# Patient Record
Sex: Female | Born: 1955 | Race: Black or African American | Hispanic: No | Smoking: Former smoker
Health system: Southern US, Community
[De-identification: ages and names within clinical notes are randomized; demographics above are authoritative.]

## PROBLEM LIST (undated history)

## (undated) DIAGNOSIS — D649 Anemia, unspecified: Secondary | ICD-10-CM

## (undated) DIAGNOSIS — N760 Acute vaginitis: Secondary | ICD-10-CM

## (undated) DIAGNOSIS — K5909 Other constipation: Principal | ICD-10-CM

## (undated) DIAGNOSIS — R0602 Shortness of breath: Secondary | ICD-10-CM

## (undated) DIAGNOSIS — E785 Hyperlipidemia, unspecified: Secondary | ICD-10-CM

## (undated) DIAGNOSIS — M545 Low back pain: Secondary | ICD-10-CM

## (undated) HISTORY — DX: Hyperlipidemia, unspecified: E78.5

## (undated) HISTORY — DX: Anemia, unspecified: D64.9

## (undated) HISTORY — PX: ABDOMINAL HYSTERECTOMY: SHX81

## (undated) HISTORY — DX: Other constipation: K59.09

## (undated) HISTORY — DX: Shortness of breath: R06.02

## (undated) HISTORY — DX: Low back pain: M54.5

## (undated) HISTORY — DX: Acute vaginitis: N76.0

---

## 1999-09-18 ENCOUNTER — Encounter: Payer: Self-pay | Admitting: Emergency Medicine

## 1999-09-18 ENCOUNTER — Emergency Department (HOSPITAL_COMMUNITY): Admission: EM | Admit: 1999-09-18 | Discharge: 1999-09-18 | Payer: Self-pay | Admitting: Emergency Medicine

## 2001-02-18 ENCOUNTER — Encounter: Admission: RE | Admit: 2001-02-18 | Discharge: 2001-02-18 | Payer: Self-pay | Admitting: Family Medicine

## 2001-02-18 ENCOUNTER — Encounter: Payer: Self-pay | Admitting: Family Medicine

## 2001-03-02 ENCOUNTER — Other Ambulatory Visit: Admission: RE | Admit: 2001-03-02 | Discharge: 2001-03-02 | Payer: Self-pay | Admitting: Obstetrics and Gynecology

## 2002-12-05 ENCOUNTER — Other Ambulatory Visit: Admission: RE | Admit: 2002-12-05 | Discharge: 2002-12-05 | Payer: Self-pay | Admitting: Obstetrics and Gynecology

## 2005-12-24 ENCOUNTER — Emergency Department (HOSPITAL_COMMUNITY): Admission: EM | Admit: 2005-12-24 | Discharge: 2005-12-24 | Payer: Self-pay | Admitting: Emergency Medicine

## 2006-01-13 ENCOUNTER — Encounter: Admission: RE | Admit: 2006-01-13 | Discharge: 2006-03-24 | Payer: Self-pay | Admitting: Sports Medicine

## 2006-01-26 ENCOUNTER — Ambulatory Visit (HOSPITAL_COMMUNITY): Admission: RE | Admit: 2006-01-26 | Discharge: 2006-01-26 | Payer: Self-pay | Admitting: Sports Medicine

## 2009-01-01 ENCOUNTER — Emergency Department (HOSPITAL_COMMUNITY): Admission: EM | Admit: 2009-01-01 | Discharge: 2009-01-01 | Payer: Self-pay | Admitting: Emergency Medicine

## 2009-11-30 ENCOUNTER — Emergency Department (HOSPITAL_COMMUNITY): Admission: EM | Admit: 2009-11-30 | Discharge: 2009-11-30 | Payer: Self-pay | Admitting: Emergency Medicine

## 2011-01-14 LAB — POCT CARDIAC MARKERS: Troponin i, poc: <0.05 ng/mL (ref 0.00–0.09)

## 2011-01-14 LAB — DIFFERENTIAL
Basophils Absolute: 0 10*3/uL (ref 0.0–0.1)
Eosinophils Absolute: 0.1 10*3/uL (ref 0.0–0.7)
Eosinophils Relative: 2 % (ref 0–5)
Lymphocytes Relative: 22 % (ref 12–46)
Monocytes Absolute: 0.4 10*3/uL (ref 0.1–1.0)
Neutrophils Relative %: 68 % (ref 43–77)

## 2011-01-14 LAB — CBC
Hemoglobin: 12.8 g/dL (ref 12.0–15.0)
MCV: 99.3 fL (ref 78.0–100.0)
Platelets: 219 10*3/uL (ref 150–400)
RBC: 3.72 MIL/uL — ABNORMAL LOW (ref 3.87–5.11)

## 2011-01-14 LAB — BASIC METABOLIC PANEL
BUN: 8 mg/dL (ref 6–23)
Creatinine, Ser: 0.75 mg/dL (ref 0.4–1.2)

## 2013-02-01 ENCOUNTER — Emergency Department (HOSPITAL_COMMUNITY): Payer: Managed Care, Other (non HMO)

## 2013-02-01 ENCOUNTER — Emergency Department (HOSPITAL_COMMUNITY)
Admission: EM | Admit: 2013-02-01 | Discharge: 2013-02-01 | Disposition: A | Discharge status: Home / Self Care | Payer: Managed Care, Other (non HMO) | Attending: Emergency Medicine | Admitting: Emergency Medicine

## 2013-02-01 ENCOUNTER — Encounter (HOSPITAL_COMMUNITY): Payer: Self-pay | Admitting: Emergency Medicine

## 2013-02-01 ENCOUNTER — Emergency Department (INDEPENDENT_AMBULATORY_CARE_PROVIDER_SITE_OTHER)
Admission: EM | Admit: 2013-02-01 | Discharge: 2013-02-01 | Disposition: A | Discharge status: Nursing Home (Includes ICF) | Payer: Managed Care, Other (non HMO) | Source: Home / Self Care | Attending: Family Medicine | Admitting: Family Medicine

## 2013-02-01 DIAGNOSIS — M545 Low back pain, unspecified: Secondary | ICD-10-CM | POA: Insufficient documentation

## 2013-02-01 DIAGNOSIS — M1610 Unilateral primary osteoarthritis, unspecified hip: Secondary | ICD-10-CM | POA: Insufficient documentation

## 2013-02-01 DIAGNOSIS — M25559 Pain in unspecified hip: Secondary | ICD-10-CM | POA: Insufficient documentation

## 2013-02-01 DIAGNOSIS — R269 Unspecified abnormalities of gait and mobility: Secondary | ICD-10-CM | POA: Insufficient documentation

## 2013-02-01 DIAGNOSIS — M47816 Spondylosis without myelopathy or radiculopathy, lumbar region: Secondary | ICD-10-CM

## 2013-02-01 DIAGNOSIS — R1032 Left lower quadrant pain: Secondary | ICD-10-CM

## 2013-02-01 DIAGNOSIS — M169 Osteoarthritis of hip, unspecified: Secondary | ICD-10-CM | POA: Insufficient documentation

## 2013-02-01 DIAGNOSIS — Z87891 Personal history of nicotine dependence: Secondary | ICD-10-CM | POA: Insufficient documentation

## 2013-02-01 DIAGNOSIS — M160 Bilateral primary osteoarthritis of hip: Secondary | ICD-10-CM

## 2013-02-01 DIAGNOSIS — M47817 Spondylosis without myelopathy or radiculopathy, lumbosacral region: Secondary | ICD-10-CM | POA: Insufficient documentation

## 2013-02-01 LAB — CBC WITH DIFFERENTIAL/PLATELET
Basophils Absolute: 0 10*3/uL (ref 0.0–0.1)
Basophils Relative: 0 % (ref 0–1)
Eosinophils Relative: 7 % — ABNORMAL HIGH (ref 0–5)
HCT: 37.6 % (ref 36.0–46.0)
Hemoglobin: 13.1 g/dL (ref 12.0–15.0)
Lymphs Abs: 1.8 10*3/uL (ref 0.7–4.0)
MCH: 32.3 pg (ref 26.0–34.0)
MCHC: 34.8 g/dL (ref 30.0–36.0)
MCV: 92.8 fL (ref 78.0–100.0)
Neutro Abs: 2.9 10*3/uL (ref 1.7–7.7)
Neutrophils Relative %: 52 % (ref 43–77)
Platelets: 245 10*3/uL (ref 150–400)
RBC: 4.05 MIL/uL (ref 3.87–5.11)

## 2013-02-01 LAB — URINE MICROSCOPIC-ADD ON

## 2013-02-01 LAB — COMPREHENSIVE METABOLIC PANEL
AST: 17 U/L (ref 0–37)
Albumin: 3.8 g/dL (ref 3.5–5.2)
Alkaline Phosphatase: 54 U/L (ref 39–117)
GFR calc Af Amer: >90 mL/min (ref >90)
Total Bilirubin: 0.3 mg/dL (ref 0.3–1.2)

## 2013-02-01 LAB — URINALYSIS, ROUTINE W REFLEX MICROSCOPIC
Specific Gravity, Urine: 1.015 (ref 1.005–1.030)
Urobilinogen, UA: 1 mg/dL (ref 0.0–1.0)
pH: 6.5 (ref 5.0–8.0)

## 2013-02-01 MED ORDER — MELOXICAM 15 MG PO TABS: 15.0000 mg | ORAL_TABLET | Freq: Every day | ORAL | Status: DC

## 2013-02-01 MED ORDER — IBUPROFEN 400 MG PO TABS
800.0000 mg | ORAL_TABLET | Freq: Once | ORAL | Status: AC
  Administered 2013-02-01: 800 mg via ORAL
  Filled 2013-02-01: qty 2

## 2013-02-01 NOTE — ED Provider Notes (Signed)
History    This chart was scribed for non-physician practitioner Arthor Captain, PA-C working with Joya Gaskins, MD by Gerlean Ren, ED Scribe. This patient was seen in room TR07C/TR07C and the patient's care was started at 7:42 PM.   CSN: [id number]  Arrival date & time 02/01/13  1654   First MD Initiated Contact with Patient 02/01/13 1754      Chief Complaint  Patient presents with  . Hip pain Back pain     The history is provided by the patient. No language interpreter was used.  Robin Clark is a 57 y.o. female who presents to the Emergency Department complaining of left hip pain with associated left lower back pain that was first noticed 5 days ago upon waking up that has waxed-and-waned since with no obvious triggers.  Pain is dull and aching that will become sharp and shooting with certain movements and is worsened by ambulation.  No associated injuries, traumas, or surgeries.  Pt denies weakness in legs, loss of bowel or bladder control, dysuria, hematuria, nausea, emesis, abdominal pain.  No h/o IV drug use.  No recent procedures.  No meningismus.  Pt was seen at Urgent Care earlier today.   History reviewed. No pertinent past medical history.  Past Surgical History  Procedure Laterality Date  . Abdominal hysterectomy      No family history on file.  History  Substance Use Topics  . Smoking status: Former Games developer  . Smokeless tobacco: Not on file  . Alcohol Use: No    No OB history provided.   Review of Systems  Constitutional: Negative for fever and chills.  HENT: Negative for trouble swallowing.   Respiratory: Negative for shortness of breath.   Cardiovascular: Negative for chest pain.  Gastrointestinal: Negative for nausea, vomiting, abdominal pain, diarrhea and constipation.  Genitourinary: Negative for dysuria, urgency, hematuria, flank pain and pelvic pain.  Musculoskeletal: Positive for back pain, arthralgias (left hip) and gait problem (Antalgic). Negative  for myalgias and joint swelling.  Skin: Negative for rash.  Neurological: Negative for weakness and numbness.  All other systems reviewed and are negative.    Allergies  Review of patient's allergies indicates no known allergies.  Home Medications   Current Outpatient Rx  Name  Route  Sig  Dispense  Refill  . Naproxen Sodium (ALEVE PO)   Oral   Take 1 tablet by mouth 2 (two) times daily as needed (pain).            BP 122/70  Pulse 85  Temp(Src) 98.6 F (37 C) (Oral)  Resp 20  SpO2 99%  Physical Exam  Nursing note and vitals reviewed. Constitutional: She is oriented to person, place, and time. She appears well-developed and well-nourished. No distress.  HENT:  Head: Normocephalic and atraumatic.  Eyes: EOM are normal.  Neck: Neck supple. No tracheal deviation present.  Cardiovascular: Normal rate.   Pulmonary/Chest: Effort normal and breath sounds normal. No respiratory distress.  Abdominal: Soft. Bowel sounds are normal. She exhibits no distension and no mass. There is no tenderness. There is no guarding.  Musculoskeletal: Normal range of motion.  Tender to palpation of lumbar paraspinal muscles, tender to palpation of iliopsoas muscle  Neurological: She is alert and oriented to person, place, and time.  5/5 strength in LEs,DTRs normal, ambulatory  Skin: Skin is warm and dry.  Psychiatric: She has a normal mood and affect. Her behavior is normal.    ED Course  Procedures (including critical care time)  DIAGNOSTIC STUDIES: Oxygen Saturation is 99% on room air, normal by my interpretation.    COORDINATION OF CARE: 7:49 PM- Patient informed of clinical course, understands medical decision-making process, and agrees with plan.  Results for orders placed during the hospital encounter of 02/01/13  URINALYSIS, ROUTINE W REFLEX MICROSCOPIC      Result Value Range   Color, Urine YELLOW  YELLOW   APPearance CLEAR  CLEAR   Specific Gravity, Urine 1.015  1.005 - 1.030    pH 6.5  5.0 - 8.0   Glucose, UA NEGATIVE  NEGATIVE mg/dL   Hgb urine dipstick TRACE (*) NEGATIVE   Bilirubin Urine NEGATIVE  NEGATIVE   Ketones, ur NEGATIVE  NEGATIVE mg/dL   Protein, ur NEGATIVE  NEGATIVE mg/dL   Urobilinogen, UA 1.0  0.0 - 1.0 mg/dL   Nitrite NEGATIVE  NEGATIVE   Leukocytes, UA SMALL (*) NEGATIVE  CBC WITH DIFFERENTIAL      Result Value Range   WBC 5.6  4.0 - 10.5 K/uL   RBC 4.05  3.87 - 5.11 MIL/uL   Hemoglobin 13.1  12.0 - 15.0 g/dL   HCT 95.6  21.3 - 08.6 %   MCV 92.8  78.0 - 100.0 fL   MCH 32.3  26.0 - 34.0 pg   MCHC 34.8  30.0 - 36.0 g/dL   RDW 57.8  46.9 - 62.9 %   Platelets 245  150 - 400 K/uL   Neutrophils Relative 52  43 - 77 %   Neutro Abs 2.9  1.7 - 7.7 K/uL   Lymphocytes Relative 32  12 - 46 %   Lymphs Abs 1.8  0.7 - 4.0 K/uL   Monocytes Relative 8  3 - 12 %   Monocytes Absolute 0.4  0.1 - 1.0 K/uL   Eosinophils Relative 7 (*) 0 - 5 %   Eosinophils Absolute 0.4  0.0 - 0.7 K/uL   Basophils Relative 0  0 - 1 %   Basophils Absolute 0.0  0.0 - 0.1 K/uL  URINE MICROSCOPIC-ADD ON      Result Value Range   Squamous Epithelial / LPF RARE  RARE   WBC, UA 0-2  <3 WBC/hpf   RBC / HPF 3-6  <3 RBC/hpf   No results found.   1. Lumbar facet arthropathy   2. Osteoarthritis of both hips       MDM   Patient with back pain.  No neurological deficits and normal neuro exam.  Patient can walk but states is painful.  No loss of bowel or bladder control.  No concern for cauda equina.  No fever, night sweats, weight loss, h/o cancer, IVDU.  RICE protocol and pain medicine indicated and discussed with patient.  Follow up with orthopedics      I personally performed the services described in this documentation, which was scribed in my presence. The recorded information has been reviewed and is accurate.     Arthor Captain, PA-C 02/04/13 1307

## 2013-02-01 NOTE — ED Notes (Signed)
Reports 2 weeks ago noticing pain in left hip and leg when going up steps.  Last week reports feeling pain a few nights.  Since then pain in groin, left leg .  Denies fall.  Patient did resume an exercise routine approx 2 weeks ago.

## 2013-02-01 NOTE — ED Notes (Signed)
Pt dc to home.   Pt states understanding to dc instructions.  Pt ambulatory to exit without difficulty.  Pt denies need for w/c. 

## 2013-02-01 NOTE — ED Notes (Signed)
Instructed to change into gown for physician examination

## 2013-02-01 NOTE — ED Notes (Signed)
Pt called no answer x1 

## 2013-02-01 NOTE — ED Notes (Signed)
In room while dr Artis Flock examined patient.

## 2013-02-01 NOTE — ED Provider Notes (Addendum)
History     CSN: [id number]  Arrival date & time 02/01/13  1518   First MD Initiated Contact with Patient 02/01/13 1601      Chief Complaint  Patient presents with  . Hip Pain    (Consider location/radiation/quality/duration/timing/severity/associated sxs/prior treatment) Patient is a 57 y.o. female presenting with hip pain. The history is provided by the patient.  Hip Pain This is a new problem. The current episode started more than 1 week ago (initially felt to be in left thigh with going up stairs, now awakening at night with pain in left flank and llq, no neuro sx, no gi or gu sx, no leg edema,.NKI.). The problem occurs constantly. The problem has been gradually worsening. Associated symptoms include abdominal pain.    History reviewed. No pertinent past medical history.  Past Surgical History  Procedure Laterality Date  . Abdominal hysterectomy      No family history on file.  History  Substance Use Topics  . Smoking status: Never Smoker   . Smokeless tobacco: Not on file  . Alcohol Use: No    OB History   Grav Para Term Preterm Abortions TAB SAB Ect Mult Living                  Review of Systems  Constitutional: Negative.   Gastrointestinal: Positive for abdominal pain.  Genitourinary: Positive for flank pain and pelvic pain.  Musculoskeletal: Negative.     Allergies  Review of patient's allergies indicates no known allergies.  Home Medications   Current Outpatient Rx  Name  Route  Sig  Dispense  Refill  . Naproxen Sodium (ALEVE PO)   Oral   Take by mouth.           BP 122/79  Pulse 75  Temp(Src) 97.8 F (36.6 C) (Oral)  Resp 20  SpO2 100%  Physical Exam  Nursing note and vitals reviewed. Constitutional: She is oriented to person, place, and time. She appears well-developed and well-nourished.  Abdominal: Soft. Bowel sounds are normal. She exhibits no distension and no mass. There is no hepatosplenomegaly. There is tenderness in the left  lower quadrant. There is no rigidity, no rebound, no guarding and no CVA tenderness. No hernia.  Musculoskeletal: Normal range of motion.       Left hip: Normal. She exhibits normal range of motion, normal strength, no tenderness, no bony tenderness and no swelling.       Legs: Neurological: She is alert and oriented to person, place, and time.  Skin: Skin is warm and dry.    ED Course  Procedures (including critical care time)  Labs Reviewed - No data to display No results found.   1. LLQ abdominal pain       MDM  Sent for prob ct abd/pelvis for vague left thigh pain of 2 weeks which now definitely is focal llq and flank tenderness in origin.        Linna Hoff, MD 02/01/13 1631  Linna Hoff, MD 02/01/13 [phone]

## 2013-02-01 NOTE — ED Notes (Signed)
The pt has lt hip and leg pain and was just sent here from [facility] to be seen for both  Complaints.  No known injury

## 2013-02-01 NOTE — ED Notes (Signed)
C/o intermittent L leg pain x 2 weeks. Intermittent L groin and hip pain since yesterday.  Sent from [facility] for further evaluation.  No known injury.

## 2013-02-01 NOTE — ED Notes (Signed)
The pt has had lt lower abd pain intermittently for one week and especially at night.  She reports that she was sent here  A c-t of her abd by u[facility].  lmp none

## 2013-02-05 NOTE — ED Provider Notes (Signed)
Medical screening examination/treatment/procedure(s) were performed by non-physician practitioner and as supervising physician I was immediately available for consultation/collaboration.   Joya Gaskins, MD 02/05/13 [phone]

## 2013-02-11 ENCOUNTER — Encounter (HOSPITAL_COMMUNITY): Payer: Self-pay | Admitting: *Deleted

## 2013-02-11 ENCOUNTER — Emergency Department (HOSPITAL_COMMUNITY): Payer: Managed Care, Other (non HMO)

## 2013-02-11 ENCOUNTER — Emergency Department (HOSPITAL_COMMUNITY)
Admission: EM | Admit: 2013-02-11 | Discharge: 2013-02-11 | Disposition: A | Discharge status: Home / Self Care | Payer: Managed Care, Other (non HMO) | Attending: Emergency Medicine | Admitting: Emergency Medicine

## 2013-02-11 DIAGNOSIS — Z87891 Personal history of nicotine dependence: Secondary | ICD-10-CM | POA: Insufficient documentation

## 2013-02-11 DIAGNOSIS — R110 Nausea: Secondary | ICD-10-CM | POA: Insufficient documentation

## 2013-02-11 DIAGNOSIS — M5430 Sciatica, unspecified side: Secondary | ICD-10-CM | POA: Insufficient documentation

## 2013-02-11 DIAGNOSIS — R0602 Shortness of breath: Secondary | ICD-10-CM | POA: Insufficient documentation

## 2013-02-11 DIAGNOSIS — Z79899 Other long term (current) drug therapy: Secondary | ICD-10-CM | POA: Insufficient documentation

## 2013-02-11 DIAGNOSIS — M545 Low back pain, unspecified: Secondary | ICD-10-CM | POA: Insufficient documentation

## 2013-02-11 DIAGNOSIS — M5126 Other intervertebral disc displacement, lumbar region: Secondary | ICD-10-CM | POA: Insufficient documentation

## 2013-02-11 DIAGNOSIS — R21 Rash and other nonspecific skin eruption: Secondary | ICD-10-CM | POA: Insufficient documentation

## 2013-02-11 DIAGNOSIS — R3919 Other difficulties with micturition: Secondary | ICD-10-CM | POA: Insufficient documentation

## 2013-02-11 DIAGNOSIS — M549 Dorsalgia, unspecified: Secondary | ICD-10-CM

## 2013-02-11 DIAGNOSIS — R6883 Chills (without fever): Secondary | ICD-10-CM | POA: Insufficient documentation

## 2013-02-11 DIAGNOSIS — M5432 Sciatica, left side: Secondary | ICD-10-CM

## 2013-02-11 MED ORDER — HYDROCODONE-ACETAMINOPHEN 5-325 MG PO TABS: 1.0000 | ORAL_TABLET | Freq: Four times a day (QID) | ORAL | Status: DC | PRN

## 2013-02-11 MED ORDER — OXYCODONE-ACETAMINOPHEN 5-325 MG PO TABS: 1.0000 | ORAL_TABLET | Freq: Once | ORAL | Status: DC

## 2013-02-11 MED ORDER — OXYCODONE-ACETAMINOPHEN 5-325 MG PO TABS
1.0000 | ORAL_TABLET | Freq: Once | ORAL | Status: AC
  Administered 2013-02-11: 1 via ORAL
  Filled 2013-02-11: qty 1

## 2013-02-11 MED ORDER — GABAPENTIN 100 MG PO CAPS: 100.0000 mg | ORAL_CAPSULE | Freq: Three times a day (TID) | ORAL | Status: DC

## 2013-02-11 NOTE — ED Notes (Addendum)
Pt was seen here recently for left hip/leg/back pain and was told it was arthritis, prescribed mobic but no releif. Went to pcp and started on vicodin, pt did not like side effects and stopped taking it. Went to [facility] yesterday and now taking prednisone and flexeril but still having severe pain, unable to sleep.

## 2013-02-11 NOTE — ED Provider Notes (Signed)
History   This chart was scribed for non-physician practitioner Raymon Mutton working with Carleene Cooper III, MD by Quintella Reichert, ED Scribe. This patient was seen in room TR10C/TR10C and the patient's care was started at 4:29 PM .  CSN: [id number]  Arrival date & time 02/11/13  1423      Chief Complaint  Patient presents with  . Hip Pain     The history is provided by the patient. No language interpreter was used.   Robin Clark is a 57 y.o. female who presents to the Emergency Department complaining of severe, constant left hip pain that began 2 weeks ago, accompanied by pain in the left upper leg and left lower back.  She states that pain originally presented suddenly 3 weeks ago on waking, and symptoms were relieved on their own before returning 3 days later.  She describes pain as dull ache in leg, and sharp pains in hip and back.  Pt states pain does not radiate and is relieved by standing and exacerbated by sitting and lying.  Pt also reports chills, difficulty urinating, mild SOB, and mild nausea.  She denies injury to leg and has no h/o similar symptoms.  Pt has visited ED multiple times over the past 2 weeks, and reports that X-rays taken at an ED visit on 02/01/13 suggested symptoms may be due to arthritis.  She was also given pain medication that failed to relieve her symptoms.  Pt's PCP also advised her to consult with orthopaedist, but pt states she has not done so.  She denies vomiting, CP abdominal pain, dysuria, headaches, dizziness, visual changes, neck pain, hematuria, vaginal discharge, vaginal pain or diarrhea.  She also reports intermittent rash on posterior upper right leg that began 3 months ago.  Pt has h/o UTIs.   PCP is Dr. Gerri Spore.    History reviewed. No pertinent past medical history.  Past Surgical History  Procedure Laterality Date  . Abdominal hysterectomy      History reviewed. No pertinent family history.  History  Substance Use Topics  .  Smoking status: Former Games developer  . Smokeless tobacco: Not on file  . Alcohol Use: No    OB History   Grav Para Term Preterm Abortions TAB SAB Ect Mult Living                  Review of Systems  Constitutional: Positive for chills.  HENT: Negative for neck pain.   Eyes: Negative for visual disturbance.  Respiratory: Positive for shortness of breath.   Cardiovascular: Negative for chest pain.  Gastrointestinal: Positive for nausea. Negative for vomiting, abdominal pain and diarrhea.  Genitourinary: Positive for difficulty urinating. Negative for dysuria, hematuria, vaginal discharge and vaginal pain.  Musculoskeletal: Positive for back pain.  Skin: Positive for rash.  Neurological: Negative for dizziness and headaches.  All other systems reviewed and are negative.     Allergies  Review of patient's allergies indicates no known allergies.  Home Medications   Current Outpatient Rx  Name  Route  Sig  Dispense  Refill  . cyclobenzaprine (FLEXERIL) 10 MG tablet   Oral   Take 10 mg by mouth every 8 (eight) hours as needed for muscle spasms.         . naproxen sodium (ANAPROX) 220 MG tablet   Oral   Take 440 mg by mouth 2 (two) times daily as needed (for pain).         . predniSONE (DELTASONE) 20 MG tablet  Oral   Take 20-60 mg by mouth daily. Takes 3 tablets daily for 3 days, then 2 tablets daily for 3 days, then 1 tablet daily for 3 days, then stop         . gabapentin (NEURONTIN) 100 MG capsule   Oral   Take 1 capsule (100 mg total) by mouth 3 (three) times daily.   60 capsule   0   . HYDROcodone-acetaminophen (NORCO) 5-325 MG per tablet   Oral   Take 1 tablet by mouth every 6 (six) hours as needed for pain.   6 tablet   0     BP 152/98  Pulse 86  Temp(Src) 97.5 F (36.4 C) (Oral)  Resp 20  SpO2 100%  Physical Exam  Nursing note and vitals reviewed. Constitutional: She is oriented to person, place, and time. She appears well-developed and  well-nourished.  HENT:  Head: Normocephalic and atraumatic.  Mouth/Throat: Oropharynx is clear and moist.  Eyes: Conjunctivae are normal. Pupils are equal, round, and reactive to light. Right eye exhibits no discharge. Left eye exhibits no discharge.  Neck: Normal range of motion. Neck supple. No tracheal deviation present.  Cardiovascular: Normal rate, regular rhythm and normal heart sounds.   No murmur heard. Negative leg swelling, negative leg edema, radial pulses 2+ bilaterally, pedal pulses 2+ bilaterally  Pulmonary/Chest: Effort normal and breath sounds normal. No respiratory distress. She has no wheezes. She has no rales.  Musculoskeletal: Normal range of motion.  No effusions, swelling, inflammation, erythema, sign of infection noted to left leg, full ROM,   Lymphadenopathy:    She has no cervical adenopathy.  Neurological: She is alert and oriented to person, place, and time. No cranial nerve deficit or sensory deficit. She exhibits normal muscle tone.  Skin: Skin is warm and dry.  No rash noted to superior aspect of right buttocks - tiny scabs noted, no inflammation, swelling, erythema, lesions noted. Patient reported that she has been using hydrocortisone cream that has been helping when rash occurs   Psychiatric: She has a normal mood and affect. Her behavior is normal. Thought content normal.     ED Course  Procedures (including critical care time)  DIAGNOSTIC STUDIES: Oxygen Saturation is 97% on room air, normal by my interpretation.    COORDINATION OF CARE: 4:55 PM-Discussed treatment plan which includes pain medication and imaging with pt at bedside and pt agreed to plan.      Labs Reviewed - No data to display Mr Lumbar Spine Wo Contrast  02/11/2013  *RADIOLOGY REPORT*  Clinical Data: 57 year old female with back pain radiating to the left hip, lower extremity.  Sudden onset pain.  MRI LUMBAR SPINE WITHOUT CONTRAST  Technique:  Multiplanar and multiecho pulse  sequences of the lumbar spine were obtained without intravenous contrast.  Comparison: Lumbar radiographs 02/01/2013.  Findings: Normal lumbar segmentation on comparison.  Stable and normal vertebral height and alignment. No marrow edema or evidence of acute osseous abnormality.   Visualized lower thoracic spinal cord is normal with conus medularis at L1. Visualized paraspinal soft tissues are within normal limits.  Negative visualized abdominal viscera.  T10-T11:  Negative.  T11-T12:  Negative.  T12-L1:  Negative.  L1-L2:  Negative disc.  Mild facet hypertrophy.  L2-L3:  Largely, disc signal at this level appears normal, but there is evidence of a left extraforaminal disc extrusion (series 7 image 12, series 8 image 15).  The signal changes obscure the exiting left L2 nerve. Superimposed mild to moderate facet  hypertrophy at this level.  No spinal stenosis.  L3-L4:  Mild circumferential disc bulge.  Moderate to severe facet and mild to moderate ligament flavum hypertrophy.  Borderline spinal stenosis.  Mild right L3 foraminal stenosis.  L4-L5:  Disc desiccation.  Mild circumferential disc bulge. Moderate facet and ligament flavum hypertrophy.  Mild bilateral L4 foraminal stenosis.  L5-S1:  Negative disc.  Moderate to severe facet hypertrophy greater on the right.  No spinal or lateral recess stenosis. Minimal to mild bilateral L5 foraminal stenosis.  IMPRESSION: 1.  Suspect a left foraminal/extraforaminal disc extrusion at L2-L3 (see series 7 image 12).  Query left L2 radiculitis. 2.  Otherwise mild for age lumbar disc degeneration.  No other focal disc herniation. 3.  Moderate to severe lower lumbar facet degeneration.  Mild right L3, bilateral L4, and bilateral L5 foraminal stenosis.   Original Report Authenticated By: Odessa Fleming III, M.D.      1. Lumbar disc herniation   2. Back pain   3. Sciatica, left       MDM  Patient afebrile, normotensive, non-tachycardic, alert and oriented. Patient given  percocet in ED setting, pain controlled. MRI to lumbar spine noted possible disc exertion at L2-L3 - possible disc herniation leading to radiculopathy pain. Negative findings for rash - tiny scabs located on superior aspect of right buttocks - patient reported that she has been using hydrocortisone cream with relief - discussed with patient to continue using this with rash if occurs again - patient denied rash recently and no itching. Patient aseptic, non-toxic appearing, in no acute distress. Discharged patient. Discharged patient with gabapentin for nerve discomfort and norco for pain control as when needed - discussed to not drink, drive, operate heavy machinery while on pain medications. Discussed with patient to rest, elevate, ice and heat application. Referred patient to orthopedics to follow-up and follow-up with PCP. Discussed with patient to monitor symptoms and if symptoms worsen or change to report back to the ED. Patient agreed to plan of care, understood, all questions answered.   I personally performed the services described in this documentation, which was scribed in my presence. The recorded information has been reviewed and is accurate.    Raymon Mutton, PA-C 02/12/13 0249 No neurovascular damage noted.  Raymon Mutton, PA-C 02/12/13 [phone]

## 2013-02-11 NOTE — ED Notes (Signed)
Patient transported to MRI 

## 2013-02-11 NOTE — ED Notes (Signed)
Patient is alert and orientedx4.  Patient was explained discharge instructions and they understood them with no questions.  The patient's daughter, Arhianna Ebey, is taking the patient home.

## 2013-02-11 NOTE — ED Notes (Signed)
Patient says she started having pain in her ankle and it radiated up her leg to her hip and this started two weeks ago.  She was seen here and discharged. The patient said she is getting worse, the pain is excruciating and it now radiates to her thigh and her hip and the middle of her buttocks (sciatica).  The patient said she cannot stand or lay down without being in severe pain.

## 2013-02-13 NOTE — ED Provider Notes (Signed)
Medical screening examination/treatment/procedure(s) were conducted as a shared visit with non-physician practitioner(s) and myself.  I personally evaluated the patient during the encounter Pt with second ED visit for pain in the left back with radiation to left leg.  Exam showed her to be in moderate to severe pain with these symptoms.  MR of LS advised, which showed HNP compressing L2 nerve root.   Carleene Cooper III, MD 02/13/13 1452

## 2013-02-14 ENCOUNTER — Ambulatory Visit: Payer: Managed Care, Other (non HMO)

## 2013-02-25 ENCOUNTER — Encounter (HOSPITAL_COMMUNITY): Payer: Self-pay | Admitting: Physical Medicine and Rehabilitation

## 2013-02-25 ENCOUNTER — Emergency Department (HOSPITAL_COMMUNITY)
Admission: EM | Admit: 2013-02-25 | Discharge: 2013-02-25 | Disposition: A | Discharge status: Home / Self Care | Payer: Managed Care, Other (non HMO) | Attending: Emergency Medicine | Admitting: Emergency Medicine

## 2013-02-25 DIAGNOSIS — R6883 Chills (without fever): Secondary | ICD-10-CM | POA: Insufficient documentation

## 2013-02-25 DIAGNOSIS — N760 Acute vaginitis: Secondary | ICD-10-CM | POA: Insufficient documentation

## 2013-02-25 DIAGNOSIS — Z87891 Personal history of nicotine dependence: Secondary | ICD-10-CM | POA: Insufficient documentation

## 2013-02-25 DIAGNOSIS — N898 Other specified noninflammatory disorders of vagina: Secondary | ICD-10-CM | POA: Insufficient documentation

## 2013-02-25 DIAGNOSIS — B9689 Other specified bacterial agents as the cause of diseases classified elsewhere: Secondary | ICD-10-CM

## 2013-02-25 DIAGNOSIS — R109 Unspecified abdominal pain: Secondary | ICD-10-CM | POA: Insufficient documentation

## 2013-02-25 LAB — URINALYSIS, ROUTINE W REFLEX MICROSCOPIC
Ketones, ur: NEGATIVE mg/dL
Nitrite: NEGATIVE
Protein, ur: NEGATIVE mg/dL
pH: 6

## 2013-02-25 LAB — WET PREP, GENITAL: Trich, Wet Prep: NONE SEEN

## 2013-02-25 MED ORDER — METRONIDAZOLE 500 MG PO TABS
500.0000 mg | ORAL_TABLET | Freq: Once | ORAL | Status: AC
  Administered 2013-02-25: 500 mg via ORAL
  Filled 2013-02-25: qty 1

## 2013-02-25 MED ORDER — METRONIDAZOLE 500 MG PO TABS: 500.0000 mg | ORAL_TABLET | Freq: Two times a day (BID) | ORAL | Status: DC

## 2013-02-25 MED ORDER — OXYCODONE-ACETAMINOPHEN 5-325 MG PO TABS
1.0000 | ORAL_TABLET | Freq: Once | ORAL | Status: AC
  Administered 2013-02-25: 1 via ORAL
  Filled 2013-02-25: qty 1

## 2013-02-25 NOTE — ED Provider Notes (Signed)
History     CSN: [id number]  Arrival date & time 02/25/13  1645   First MD Initiated Contact with Patient 02/25/13 1715      Chief Complaint  Patient presents with  . Leg Pain  . Chills     HPI The patient's main reason for presenting today with new vaginal discharge and lower abdominal pain.  No dysuria or urinary frequency.  Her discomfort is mild to moderate in severity.  She has not had intercourse in many years.  She denies vaginal penetration by any object.  She denies nausea vomiting diarrhea.  No fevers or chills.  She's had some other more chronic issues with left-sided groin pain that radiates to her knee which is being addressed by her primary care physicians and that is on her records and her present in the emergency department today.   Past medical history: None  Past Surgical History  Procedure Laterality Date  . Abdominal hysterectomy      No family history on file.  History  Substance Use Topics  . Smoking status: Former Games developer  . Smokeless tobacco: Not on file  . Alcohol Use: No    OB History   Grav Para Term Preterm Abortions TAB SAB Ect Mult Living                  Review of Systems  All other systems reviewed and are negative.    Allergies  Review of patient's allergies indicates no known allergies.  Home Medications   Current Outpatient Rx  Name  Route  Sig  Dispense  Refill  . naproxen sodium (ANAPROX) 220 MG tablet   Oral   Take 440 mg by mouth every 6 (six) hours as needed (for pain). For pain         . metroNIDAZOLE (FLAGYL) 500 MG tablet   Oral   Take 1 tablet (500 mg total) by mouth 2 (two) times daily.   14 tablet   0     BP 118/76  Pulse 95  Temp(Src) 98.9 F (37.2 C) (Oral)  Resp 20  SpO2 100%  Physical Exam  Nursing note and vitals reviewed. Constitutional: She is oriented to person, place, and time. She appears well-developed and well-nourished. No distress.  HENT:  Head: Normocephalic and atraumatic.  Eyes:  EOM are normal.  Neck: Normal range of motion.  Cardiovascular: Normal rate, regular rhythm and normal heart sounds.   Pulmonary/Chest: Effort normal and breath sounds normal.  Abdominal: Soft. She exhibits no distension. There is no tenderness.  Genitourinary:  Normal external genitalia.  Uterus/cervix absent.  Small white vaginal discharge noted in the vaginal vault.  No tenderness.  No bleeding  Musculoskeletal: Normal range of motion.  Neurological: She is alert and oriented to person, place, and time.  Skin: Skin is warm and dry.  Psychiatric: She has a normal mood and affect. Judgment normal.    ED Course  Procedures (including critical care time)  Labs Reviewed  WET PREP, GENITAL - Abnormal; Notable for the following:    Clue Cells Wet Prep HPF POC FEW (*)    WBC, Wet Prep HPF POC TOO NUMEROUS TO COUNT (*)    All other components within normal limits  URINALYSIS, ROUTINE W REFLEX MICROSCOPIC - Abnormal; Notable for the following:    APPearance CLOUDY (*)    Hgb urine dipstick TRACE (*)    All other components within normal limits  GC/CHLAMYDIA PROBE AMP  URINE MICROSCOPIC-ADD ON   No  results found.   1. BV (bacterial vaginosis)       MDM  Bacterial vaginosis.  PCP followup.  Flagyl now and for 7 days        Lyanne Co, MD 02/25/13 2124

## 2013-02-25 NOTE — ED Notes (Signed)
Pt given crackers and peanut butter with drink.

## 2013-02-25 NOTE — ED Notes (Signed)
Pt presents to department for evaluation of L sided groin pain radiating to knee. Also states chills and generalized pain. 10/10 pain at the time. Pt tearful upon arrival.

## 2013-06-28 ENCOUNTER — Other Ambulatory Visit (HOSPITAL_COMMUNITY)
Admission: RE | Admit: 2013-06-28 | Discharge: 2013-06-28 | Disposition: A | Discharge status: Home / Self Care | Payer: Managed Care, Other (non HMO) | Source: Ambulatory Visit | Attending: Family Medicine | Admitting: Family Medicine

## 2013-06-28 ENCOUNTER — Other Ambulatory Visit (HOSPITAL_COMMUNITY): Payer: Self-pay | Admitting: Family Medicine

## 2013-06-28 ENCOUNTER — Other Ambulatory Visit: Payer: Self-pay | Admitting: Family Medicine

## 2013-06-28 DIAGNOSIS — Z1231 Encounter for screening mammogram for malignant neoplasm of breast: Secondary | ICD-10-CM

## 2013-06-28 DIAGNOSIS — Z01419 Encounter for gynecological examination (general) (routine) without abnormal findings: Secondary | ICD-10-CM | POA: Insufficient documentation

## 2013-07-05 ENCOUNTER — Ambulatory Visit (HOSPITAL_COMMUNITY)
Admission: RE | Admit: 2013-07-05 | Discharge: 2013-07-05 | Disposition: A | Discharge status: Home / Self Care | Payer: Managed Care, Other (non HMO) | Source: Ambulatory Visit | Attending: Family Medicine | Admitting: Family Medicine

## 2013-07-05 DIAGNOSIS — Z1231 Encounter for screening mammogram for malignant neoplasm of breast: Secondary | ICD-10-CM | POA: Insufficient documentation

## 2014-10-10 ENCOUNTER — Encounter (HOSPITAL_COMMUNITY): Payer: Self-pay | Admitting: Emergency Medicine

## 2014-10-10 ENCOUNTER — Emergency Department (INDEPENDENT_AMBULATORY_CARE_PROVIDER_SITE_OTHER)
Admission: EM | Admit: 2014-10-10 | Discharge: 2014-10-10 | Disposition: A | Discharge status: Home / Self Care | Payer: Managed Care, Other (non HMO) | Source: Home / Self Care | Attending: Emergency Medicine | Admitting: Emergency Medicine

## 2014-10-10 DIAGNOSIS — B9789 Other viral agents as the cause of diseases classified elsewhere: Secondary | ICD-10-CM

## 2014-10-10 DIAGNOSIS — J040 Acute laryngitis: Secondary | ICD-10-CM

## 2014-10-10 MED ORDER — BENZONATATE 200 MG PO CAPS: 200.0000 mg | ORAL_CAPSULE | Freq: Three times a day (TID) | ORAL | Status: DC | PRN

## 2014-10-10 MED ORDER — IPRATROPIUM BROMIDE 0.06 % NA SOLN: 2.0000 | Freq: Four times a day (QID) | NASAL | Status: DC

## 2014-10-10 MED ORDER — PREDNISONE 20 MG PO TABS: 20.0000 mg | ORAL_TABLET | Freq: Two times a day (BID) | ORAL | Status: DC

## 2014-10-10 NOTE — ED Provider Notes (Signed)
   Chief Complaint   Cough; Hoarse; URI; and Otalgia   History of Present Illness   Robin Clark is a 58 year old female who has had a four-day history of cough productive of clear sputum, scratchy throat, hoarseness, right ear pain and pressure, nasal congestion with clear rhinorrhea, headache, and abdominal pain. She denies fever, chills, wheezing, or chest pain. She's been exposed to her boyfriend who had the same thing.  Review of Systems   Other than as noted above, the patient denies any of the following symptoms: Systemic:  No fevers, chills, sweats, or myalgias. Eye:  No redness or discharge. ENT:  No ear pain, headache, nasal congestion, drainage, sinus pressure, or sore throat. Neck:  No neck pain, stiffness, or swollen glands. Lungs:  No cough, sputum production, hemoptysis, wheezing, chest tightness, shortness of breath or chest pain. GI:  No abdominal pain, nausea, vomiting or diarrhea.  PMFSH   Past medical history, family history, social history, meds, and allergies were reviewed. She is allergic to hydrocodone. 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  Physical exam   Vital signs:  BP 116/80 mmHg  Pulse 68  Temp(Src) 98.6 F (37 C) (Oral)  Resp 14  SpO2 100% General:  Alert and oriented.  In no distress.  Skin warm and dry. Eye:  No conjunctival injection or drainage. Lids were normal. ENT:  TMs and canals were normal, without erythema or inflammation.  Nasal mucosa was clear and uncongested, without drainage.  Mucous membranes were moist.  Pharynx was clear with no exudate or drainage.  There were no oral ulcerations or lesions. Neck:  Supple, no adenopathy, tenderness or mass. Lungs:  No respiratory distress.  Lungs were clear to auscultation, without wheezes, rales or rhonchi.  Breath sounds were clear and equal bilaterally.  Heart:  Regular rhythm, without gallops, murmers or rubs. Skin:  Clear, warm, and dry, without rash or  lesions.  Assessment     The encounter diagnosis was Viral laryngitis.  There is no evidence of pneumonia, strep throat, sinusitis, otitis media.    Plan    1.  Meds:  The following meds were prescribed:   New Prescriptions   BENZONATATE (TESSALON) 200 MG CAPSULE    Take 1 capsule (200 mg total) by mouth 3 (three) times daily as needed for cough.   IPRATROPIUM (ATROVENT) 0.06 % NASAL SPRAY    Place 2 sprays into both nostrils 4 (four) times daily.   PREDNISONE (DELTASONE) 20 MG TABLET    Take 1 tablet (20 mg total) by mouth 2 (two) times daily.    2.  Patient Education/Counseling:  The patient was given appropriate handouts, self care instructions, and instructed in symptomatic relief.  Instructed to get extra fluids and extra rest.    3.  Follow up:  The patient was told to follow up here if no better in 3 to 4 days, or sooner if becoming worse in any way, and given some red flag symptoms such as increasing fever, difficulty breathing, chest pain, or persistent vomiting which would prompt immediate return.       Reuben Likesavid C Rosielee Corporan, MD 10/10/14 [phone]

## 2014-10-10 NOTE — Discharge Instructions (Signed)
Laryngitis °At the top of your windpipe is your voice box. It is the source of your voice. Inside your voice box are 2 bands of muscles called vocal cords. When you breathe, your vocal cords are relaxed and open so that air can get into the lungs. When you decide to say something, these cords come together and vibrate. The sound from these vibrations goes into your throat and comes out through your mouth as sound.  °Laryngitis is an inflammation of the vocal cords that causes hoarseness, cough, loss of voice, sore throat, and dry throat. Laryngitis can be temporary (acute) or long-term (chronic). Most cases of acute laryngitis improve with time.Chronic laryngitis lasts for more than 3 weeks. °CAUSES °Laryngitis can often be related to excessive smoking, talking, or yelling, as well as inhalation of toxic fumes and allergies. Acute laryngitis is usually caused by a viral infection, vocal strain, measles or mumps, or bacterial infections. Chronic laryngitis is usually caused by vocal cord strain, vocal cord injury, postnasal drip, growths on the vocal cords, or acid reflux. °SYMPTOMS  °1. Cough. °2. Sore throat. °3. Dry throat. °RISK FACTORS °· Respiratory infections. °· Exposure to irritating substances, such as cigarette smoke, excessive amounts of alcohol, stomach acids, and workplace chemicals. °· Voice trauma, such as vocal cord injury from shouting or speaking too loud. °DIAGNOSIS  °Your cargiver will perform a physical exam. During the physical exam, your caregiver will examine your throat. The most common sign of laryngitis is hoarseness. Laryngoscopy may be necessary to confirm the diagnosis of this condition. This procedure allows your caregiver to look into the larynx. °HOME CARE INSTRUCTIONS °· Drink enough fluids to keep your urine clear or pale yellow. °· Rest until you no longer have symptoms or as directed by your caregiver. °· Breathe in moist air. °· Take all medicine as directed by your  caregiver. °· Do not smoke. °· Talk as little as possible (this includes whispering). °· Write on paper instead of talking until your voice is back to normal. °· Follow up with your caregiver if your condition has not improved after 10 days. °SEEK MEDICAL CARE IF:  °· You have trouble breathing. °· You cough up blood. °· You have persistent fever. °· You have increasing pain. °· You have difficulty swallowing. °MAKE SURE YOU: °· Understand these instructions. °· Will watch your condition. °· Will get help right away if you are not doing well or get worse. °Document Released: 10/12/2005 Document Revised: 01/04/2012 Document Reviewed: 12/18/2010 °[employer]® Patient Information ©2015 [employer]. This information is not intended to replace advice given to you by your health care provider. Make sure you discuss any questions you have with your health care provider. ° °Most upper respiratory infections are caused by viruses and do not require antibiotics.  We try to save the antibiotics for when we really need them to prevent bacteria from developing resistance to them.  Here are a few hints about things that can be done at home to help get over an upper respiratory infection quicker: ° °Get extra sleep and extra fluids.  Get 7 to 9 hours of sleep per night and 6 to 8 glasses of water a day.  Getting extra sleep keeps the immune system from getting run down.  Most people with an upper respiratory infection are a little dehydrated.  The extra fluids also keep the secretions liquified and easier to deal with.  Also, get extra vitamin C.  4000 mg per day is the recommended dose. °For   the aches, headache, and fever, acetaminophen or ibuprofen are helpful.  These can be alternated every 4 hours.  People with liver disease should avoid large amounts of acetaminophen, and people with ulcer disease, gastroesophageal reflux, gastritis, congestive heart failure, chronic kidney disease, coronary artery disease and the elderly  should avoid ibuprofen. °For nasal congestion try Mucinex-D, or if you're having lots of sneezing or clear nasal drainage use Zyrtec-D. People with high blood pressure can take these if their blood pressure is controlled, if not, it's best to avoid the forms with a "D" (decongestants).  You can use the plain Mucinex, Allegra, Claritin, or Zyrtec even if your blood pressure is not controlled.   °A Saline nasal spray such as Ocean Spray can also help.  You can add a decongestant sprays such as Afrin, but you should not use the decongestant sprays for more than 3 or 4 days since they can be habituating.  Breathe Rite nasal strips can also offer a non-drug alternative treatment to nasal congestion, especially at night. °For people with symptoms of sinusitis, sleeping with your head elevated can be helpful.  For sinus pain, moist, hot compresses to the face may provide some relief.  Many people find that inhaling steam as in a shower or from a pot of steaming water can help. °For any viral infection, zinc containing lozenges such as Cold-Eze or Zicam are helpful.  Zinc helps to fight viral infection.  Hot salt water gargles (8 oz of hot water, 1/2 tsp of table salt, and a pinch of baking soda) can give relief as well as hot beverages such as hot tea.  Sucrets extra strength lozenges will help the sore throat.  °For the cough, take Delsym 2 tsp every 12 hours.  It has also been found recently that Aleve can help control a cough.  The dose is 1 to 2 tablets twice daily with food.  This can be combined with Delsym. (Note, if you are taking ibuprofen, you should not take Aleve as well--take one or the other.) °A cool mist vaporizer will help keep your mucous membranes from drying out.  ° °It's important when you have an upper respiratory infection not to pass the infection to others.  This involves being very careful about the following: ° °Frequent hand washing or use of hand sanitizer, especially after coughing, sneezing,  blowing your nose or touching your face, nose or eyes. °Do not shake hands or touch anyone and try to avoid touching surfaces that other people use such as doorknobs, shopping carts, telephones and computer keyboards. °Use tissues and dispose of them properly in a garbage can or ziplock bag. °Cough into your sleeve. °Do not let others eat or drink after you. ° °It's also important to recognize the signs of serious illness and get evaluated if they occur: °Any respiratory infection that lasts more than 7 to 10 days.  Yellow nasal drainage and sputum are not reliable indicators of a bacterial infection, but if they last for more than 1 week, see your doctor. °Fever and sore throat can indicate strep. °Fever and cough can indicate influenza or pneumonia. °Any kind of severe symptom such as difficulty breathing, intractable vomiting, or severe pain should prompt you to see a doctor as soon as possible. ° ° °Your body's immune system is really the thing that will get rid of this infection.  Your immune system is comprised of 2 types of specialized cells called T cells and B cells.  T cells coordinate   the array of cells in your body that engulf invading bacteria or viruses while B cells orchestrate the production of antibodies that neutralize infection.  Anything we do or any medications we give you, will just strengthen your immune system or help it clear up the infection quicker.  Here are a few helpful hints to improve your immune system to help overcome this illness or to prevent future infections: °· A few vitamins can improve the health of your immune system.  That's why your diet should include plenty of fruits, vegetables, fish, nuts, and whole grains. °· Vitamin A and bet-carotene can increase the cells that fight infections (T cells and B cells).  Vitamin A is abundant in dark greens and orange vegetables such as spinach, greens, sweet potatoes, and carrots. °· Vitamin B6 contributes to the maturation of white  blood cells, the cells that fight disease.  Foods with vitamin B6 include cold cereal and bananas. °· Vitamin C is credited with preventing colds because it increases white blood cells and also prevents cellular damage.  Citrus fruits, peaches and green and red bell peppers are all hight in vitamin C. °· Vitamin E is an anti-oxidant that encourages the production of natural killer cells which reject foreign invaders and B cells that produce antibodies.  Foods high in vitamin E include wheat germ, nuts and seeds. °· Foods high in omega-3 fatty acids found in foods like salmon, tuna and mackerel boost your immune system and help cells to engulf and absorb germs. °· Probiotics are good bacteria that increase your T cells.  These can be found in yogurt and are available in supplements such as Culturelle or Align. °· Moderate exercise increases the strength of your immune system and your ability to recover from illness.  I suggest 3 to 5 moderate intensity 30 minute workouts per week.   °· Sleep is another component of maintaining a strong immune system.  It enables your body to recuperate from the day's activities, stress and work.  My recommendation is to get between 7 and 9 hours of sleep per night. °· If you smoke, try to quit completely or at least cut down.  Drink alcohol only in moderation if at all.  No more than 2 drinks daily for men or 1 for women. °· Get a flu vaccine early in the fall or if you have not gotten one yet, once this illness has run its course.  If you are over 65, a smoker, or an asthmatic, get a pneumococcal vaccine. °· My final recommendation is to maintain a healthy weight.  Excess weight can impair the immune system by interfering with the way the immune system deals with invading viruses or bacteria. °·  °

## 2014-10-10 NOTE — ED Notes (Signed)
Pt has been suffering from URI symptoms for two days.  Pt denies any fever.

## 2014-12-02 IMAGING — CR DG LUMBAR SPINE COMPLETE 4+V
5 series · 5 of 5 positions shown · non-contrast
Comparison: Concurrently obtained radiographs the pelvis left hip

CLINICAL DATA: Left hip and back pain, no known injury

LUMBAR SPINE - COMPLETE 4+ VIEW

[t lumbar spine ap]
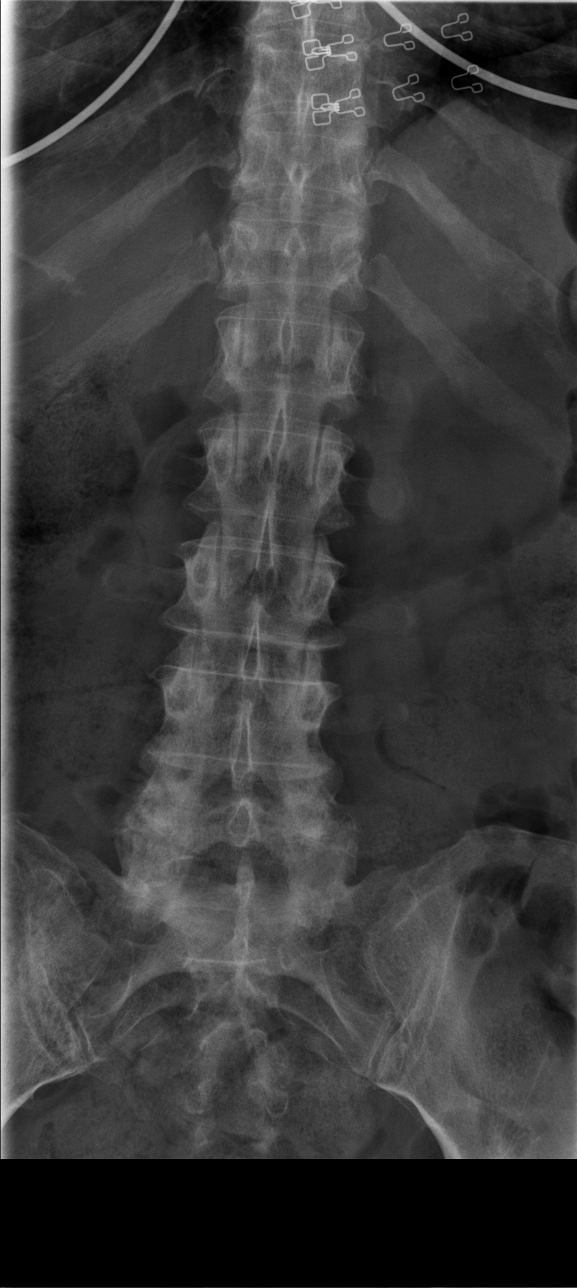

[t lumbar spine obl (1 of 2)]
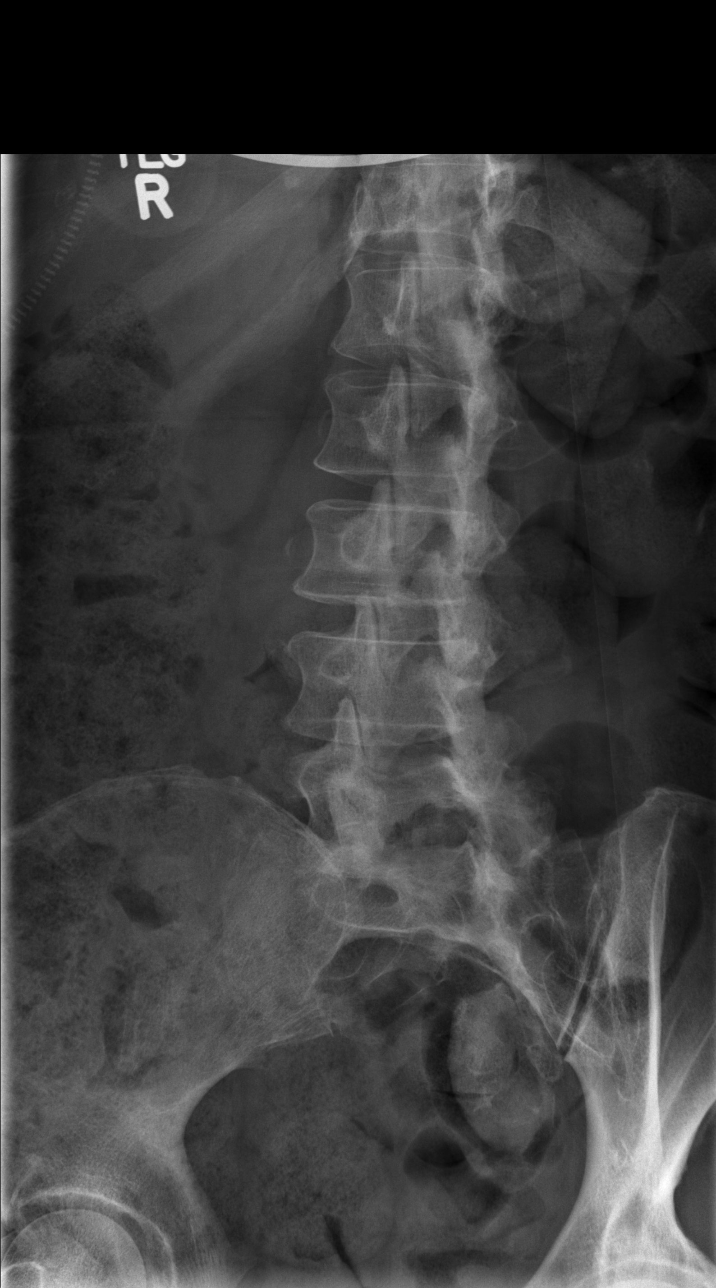

[t lumbar spine obl (2 of 2)]
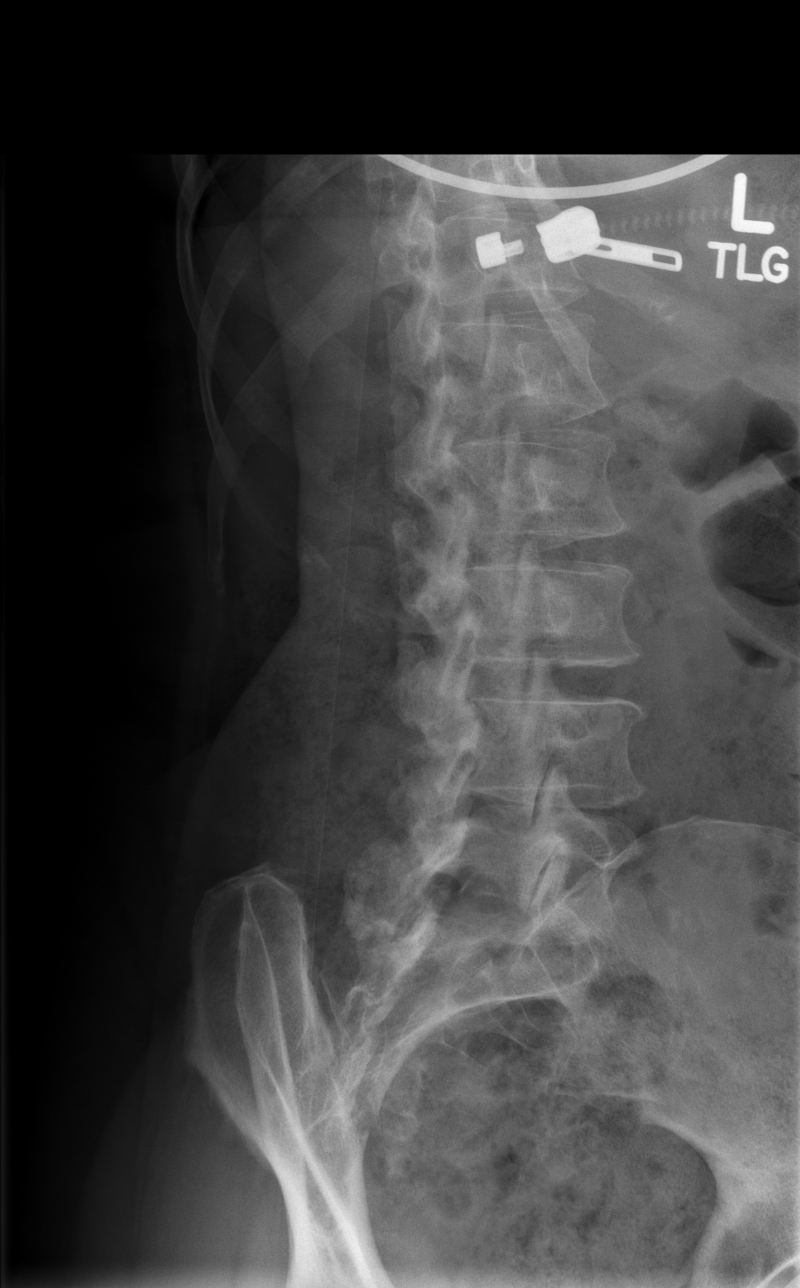

[t lumbar spine lat]
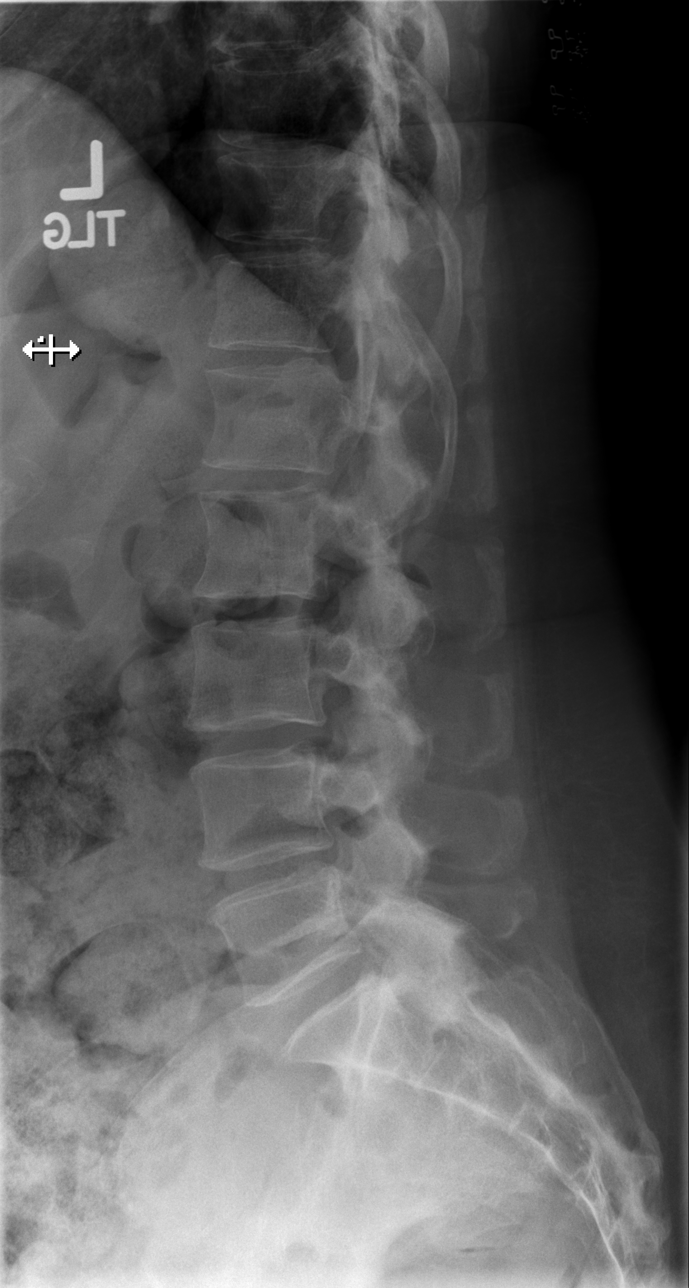

[t lumbar l-5 s-1 spot]
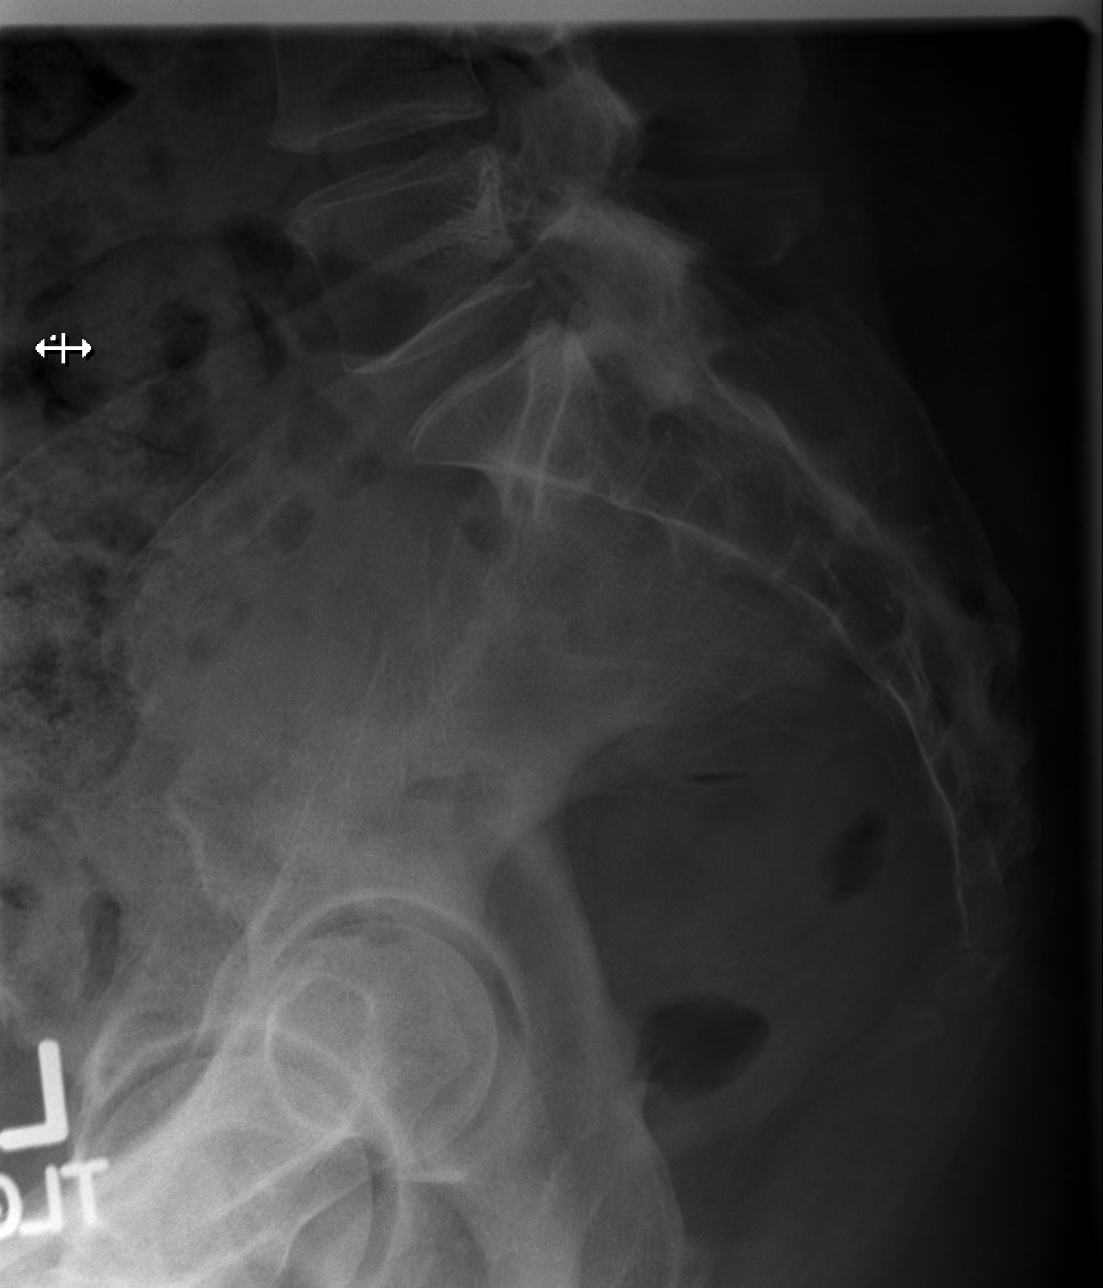

[5 of 5 positions shown; findings below may reference images not displayed]

FINDINGS: AP, lateral and bilateral oblique radiographs of the
lumbosacral spine demonstrate no acute fracture or malalignment.
Vertebral body heights are maintained.  There is mild lower lumbar
degenerative disc disease and moderate L5-S1 facet arthropathy.
Normal bony mineralization.  Unremarkable bowel gas pattern.
IMPRESSION: 1.  No acute osseous abnormality.
2.  Lower lumbar facet arthropathy.

## 2014-12-02 IMAGING — CR DG HIP (WITH OR WITHOUT PELVIS) 2-3V*L*
3 series · 3 of 3 positions shown · non-contrast
Comparison: Concurrently obtained radiographs the lumbosacral spine

CLINICAL DATA: Left hip and leg pain, no known injury

LEFT HIP - COMPLETE 2+ VIEW

[t pelvis ap]
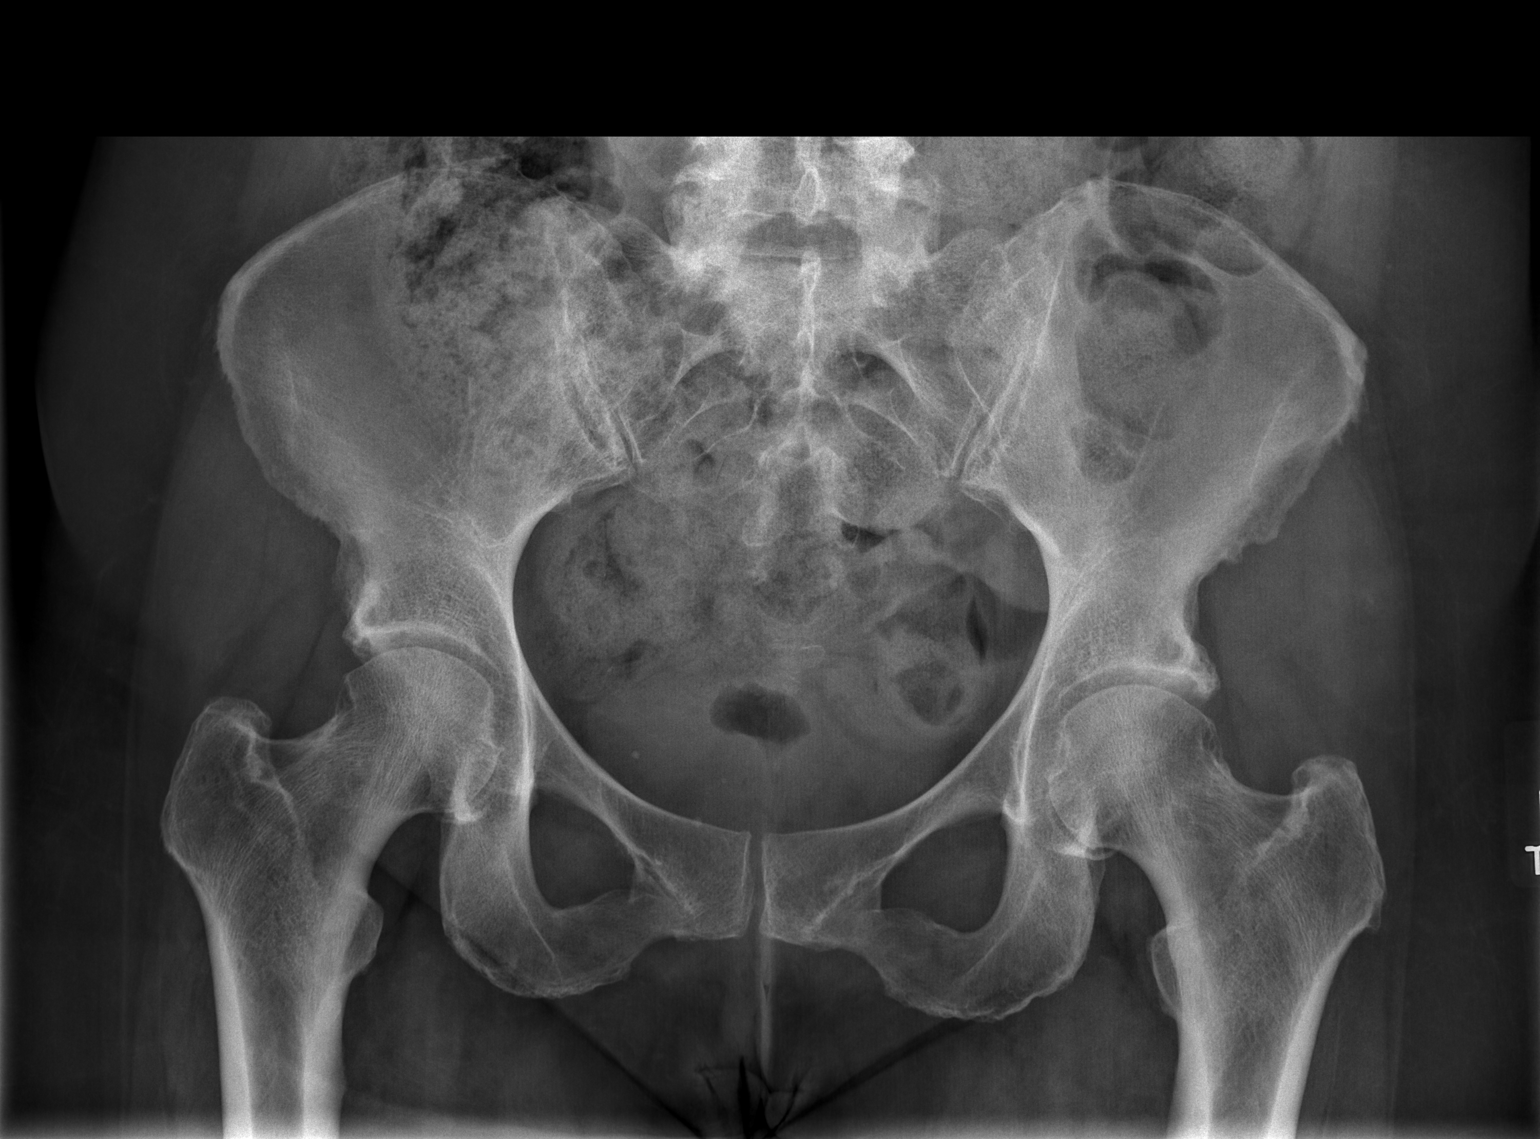

[t hip ap left]
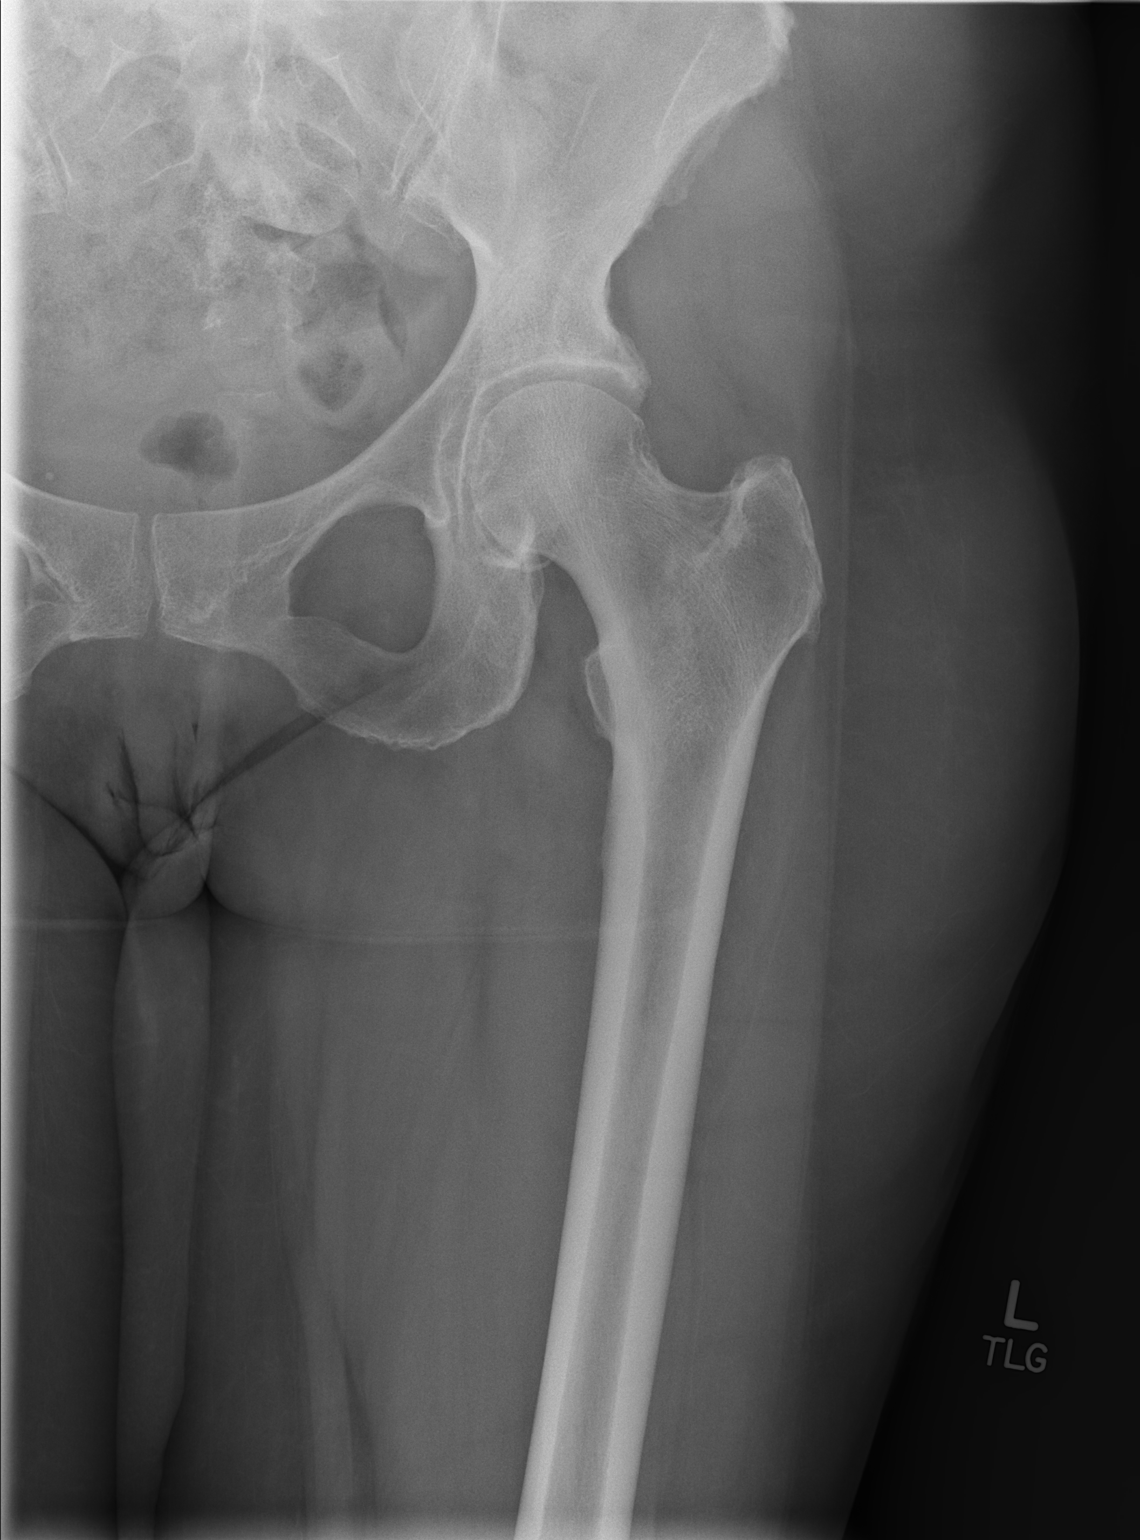

[t hip frog leg left]
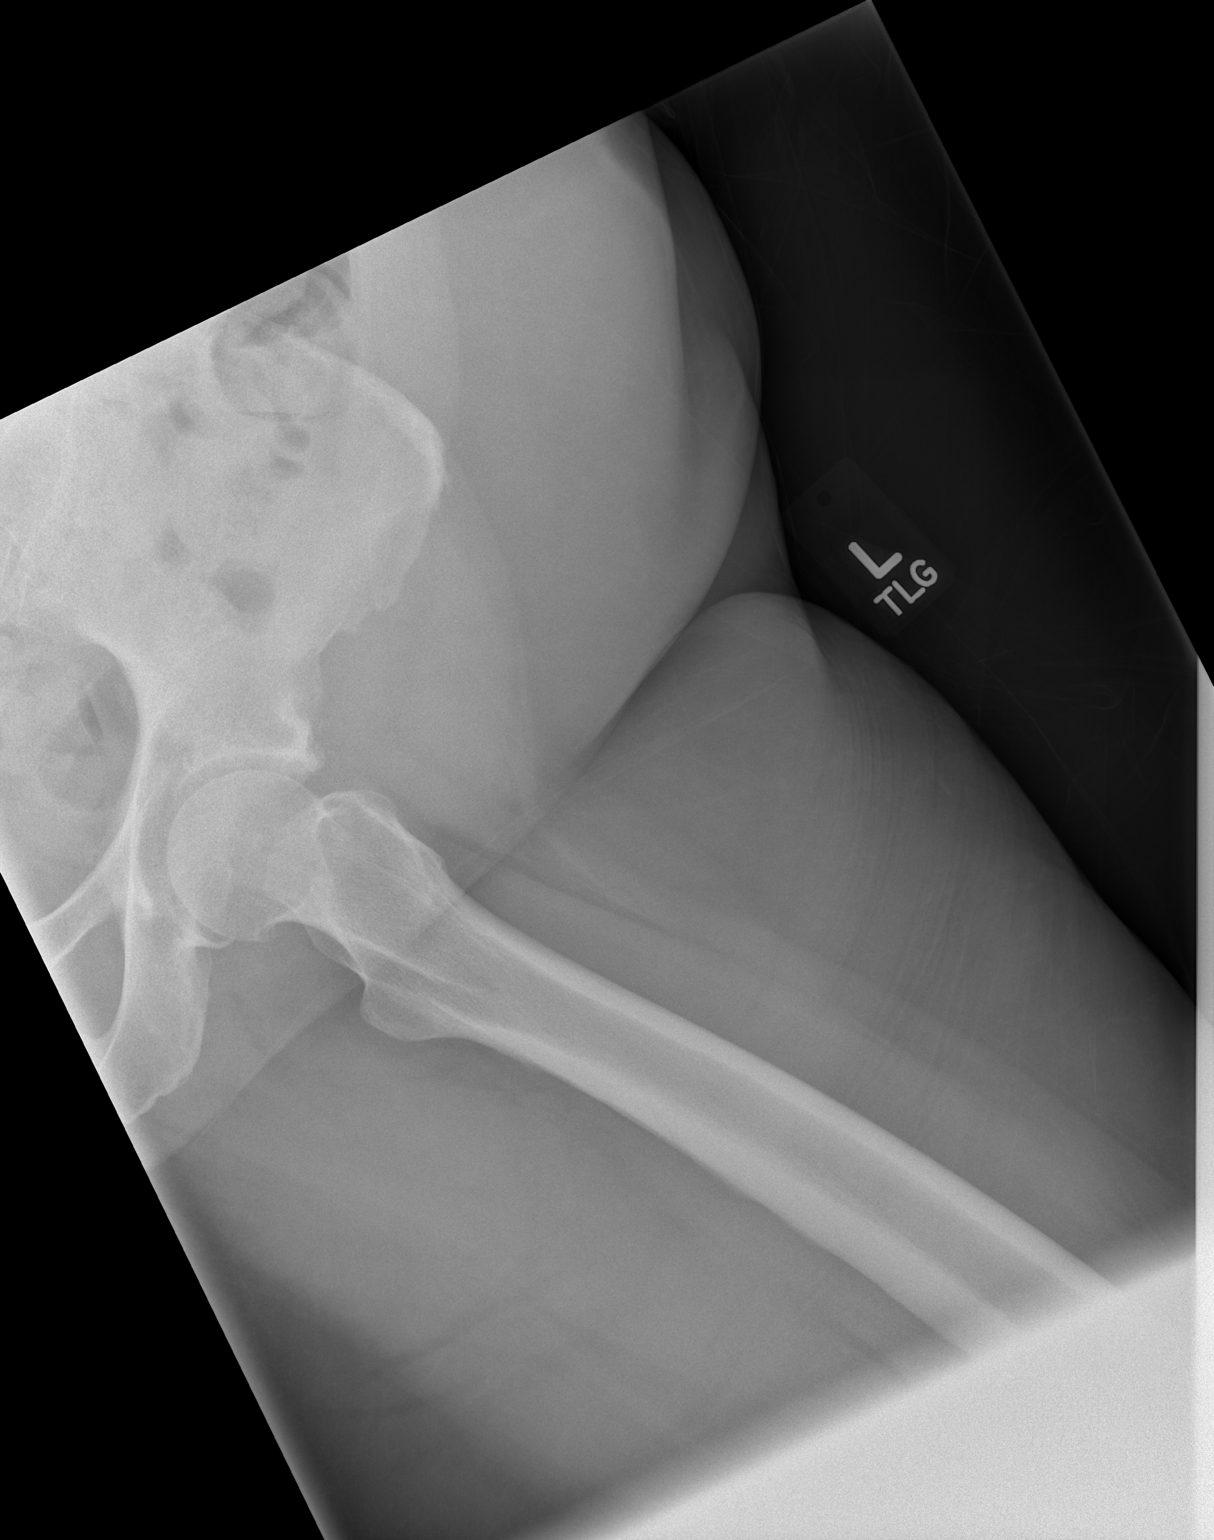

[3 of 3 positions shown; findings below may reference images not displayed]

FINDINGS: No acute fracture, malalignment or aggressive appearing
osseous lesion.  Mild bilateral hip osteoarthritis.  Lower lumbar
degenerative change with facet arthropathy.  Unremarkable
visualized bowel gas pattern.
IMPRESSION: 1.  No acute osseous abnormality.
2.  Bilateral mild hip joint osteoarthritis.

## 2015-09-28 ENCOUNTER — Encounter (HOSPITAL_COMMUNITY): Payer: Self-pay | Admitting: Emergency Medicine

## 2015-09-28 ENCOUNTER — Emergency Department (INDEPENDENT_AMBULATORY_CARE_PROVIDER_SITE_OTHER)
Admission: EM | Admit: 2015-09-28 | Discharge: 2015-09-28 | Disposition: A | Discharge status: Home / Self Care | Payer: Managed Care, Other (non HMO) | Source: Home / Self Care | Attending: Family Medicine | Admitting: Family Medicine

## 2015-09-28 DIAGNOSIS — J069 Acute upper respiratory infection, unspecified: Secondary | ICD-10-CM

## 2015-09-28 NOTE — ED Provider Notes (Signed)
CSN: [id number]     Arrival date & time 09/28/15  1445 History   First MD Initiated Contact with Patient 09/28/15 1607     Chief Complaint  Patient presents with  . URI   (Consider location/radiation/quality/duration/timing/severity/associated sxs/prior Treatment) HPI Comments: 59 year old female complaining of nasal congestion, PND, runny nose and feeling hot and sweaty on occasion for proximal with 5 days. Denies fever.   History reviewed. No pertinent past medical history. Past Surgical History  Procedure Laterality Date  . Abdominal hysterectomy     No family history on file. Social History  Substance Use Topics  . Smoking status: Former Games developer  . Smokeless tobacco: None  . Alcohol Use: No   OB History    No data available     Review of Systems  Constitutional: Positive for activity change and fatigue. Negative for fever, chills and appetite change.  HENT: Positive for congestion, postnasal drip and rhinorrhea. Negative for facial swelling and sore throat.   Eyes: Negative.   Respiratory: Positive for cough. Negative for shortness of breath.   Cardiovascular: Negative.   Musculoskeletal: Negative for neck pain and neck stiffness.  Skin: Negative for pallor and rash.  Neurological: Negative.     Allergies  Hydrocodone  Home Medications   Prior to Admission medications   Medication Sig Start Date End Date Taking? Authorizing Provider  benzonatate (TESSALON) 200 MG capsule Take 1 capsule (200 mg total) by mouth 3 (three) times daily as needed for cough. 10/10/14   Reuben Likes, MD  gabapentin (NEURONTIN) 100 MG capsule Take 100 mg by mouth as needed.    Historical Provider, MD  ipratropium (ATROVENT) 0.06 % nasal spray Place 2 sprays into both nostrils 4 (four) times daily. 10/10/14   Reuben Likes, MD  metroNIDAZOLE (FLAGYL) 500 MG tablet Take 1 tablet (500 mg total) by mouth 2 (two) times daily. 02/25/13   Azalia Bilis, MD  naproxen sodium (ANAPROX) 220 MG tablet  Take 440 mg by mouth every 6 (six) hours as needed (for pain). For pain    Historical Provider, MD  predniSONE (DELTASONE) 20 MG tablet Take 1 tablet (20 mg total) by mouth 2 (two) times daily. 10/10/14   Reuben Likes, MD   Meds Ordered and Administered this Visit  Medications - No data to display  BP 111/81 mmHg  Pulse 83  Temp(Src) 98.2 F (36.8 C) (Oral)  SpO2 100% No data found.   Physical Exam  Constitutional: She is oriented to person, place, and time. She appears well-developed and well-nourished. No distress.  HENT:  Mouth/Throat: No oropharyngeal exudate.  Bilateral TMs are normal Oropharynx with minimal erythema and frothy clear PND  Eyes: Conjunctivae and EOM are normal.  Neck: Normal range of motion. Neck supple.  Cardiovascular: Normal rate, regular rhythm and normal heart sounds.   Pulmonary/Chest: Effort normal and breath sounds normal. No respiratory distress. She has no wheezes. She has no rales.  Musculoskeletal: Normal range of motion. She exhibits no edema.  Lymphadenopathy:    She has no cervical adenopathy.  Neurological: She is alert and oriented to person, place, and time.  Skin: Skin is warm and dry. No rash noted.  Psychiatric: She has a normal mood and affect.  Nursing note and vitals reviewed.   ED Course  Procedures (including critical care time)  Labs Review Labs Reviewed - No data to display  Imaging Review No results found.   Visual Acuity Review  Right Eye Distance:   Left Eye  Distance:   Bilateral Distance:    Right Eye Near:   Left Eye Near:    Bilateral Near:      DX: URI   MDM  upper Respiratory Infection, Adult For drainage recommend Zyrtec, Allegra or Claritin. May use saline nasal spray frequently. Ibuprofen or Tylenol for discomfort. Drink any fluids and stay well-hydrated Rest as much as possible.   Hayden Rasmussenavid Rogan Ecklund, NP 09/28/15 [phone]

## 2015-09-28 NOTE — Discharge Instructions (Signed)
Upper Respiratory Infection, Adult For drainage recommend Zyrtec, Allegra or Claritin. May use saline nasal spray frequently. Ibuprofen or Tylenol for discomfort. Drink any fluids and stay well-hydrated Rest as much as possible. Most upper respiratory infections (URIs) are a viral infection of the air passages leading to the lungs. A URI affects the nose, throat, and upper air passages. The most common type of URI is nasopharyngitis and is typically referred to as "the common cold." URIs run their course and usually go away on their own. Most of the time, a URI does not require medical attention, but sometimes a bacterial infection in the upper airways can follow a viral infection. This is called a secondary infection. Sinus and middle ear infections are common types of secondary upper respiratory infections. Bacterial pneumonia can also complicate a URI. A URI can worsen asthma and chronic obstructive pulmonary disease (COPD). Sometimes, these complications can require emergency medical care and may be life threatening.  CAUSES Almost all URIs are caused by viruses. A virus is a type of germ and can spread from one person to another.  RISKS FACTORS You may be at risk for a URI if:   You smoke.   You have chronic heart or lung disease.  You have a weakened defense (immune) system.   You are very young or very old.   You have nasal allergies or asthma.  You work in crowded or poorly ventilated areas.  You work in health care facilities or schools. SIGNS AND SYMPTOMS  Symptoms typically develop 2-3 days after you come in contact with a cold virus. Most viral URIs last 7-10 days. However, viral URIs from the influenza virus (flu virus) can last 14-18 days and are typically more severe. Symptoms may include:   Runny or stuffy (congested) nose.   Sneezing.   Cough.   Sore throat.   Headache.   Fatigue.   Fever.   Loss of appetite.   Pain in your forehead, behind  your eyes, and over your cheekbones (sinus pain).  Muscle aches.  DIAGNOSIS  Your health care provider may diagnose a URI by:  Physical exam.  Tests to check that your symptoms are not due to another condition such as:  Strep throat.  Sinusitis.  Pneumonia.  Asthma. TREATMENT  A URI goes away on its own with time. It cannot be cured with medicines, but medicines may be prescribed or recommended to relieve symptoms. Medicines may help:  Reduce your fever.  Reduce your cough.  Relieve nasal congestion. HOME CARE INSTRUCTIONS   Take medicines only as directed by your health care provider.   Gargle warm saltwater or take cough drops to comfort your throat as directed by your health care provider.  Use a warm mist humidifier or inhale steam from a shower to increase air moisture. This may make it easier to breathe.  Drink enough fluid to keep your urine clear or pale yellow.   Eat soups and other clear broths and maintain good nutrition.   Rest as needed.   Return to work when your temperature has returned to normal or as your health care provider advises. You may need to stay home longer to avoid infecting others. You can also use a face mask and careful hand washing to prevent spread of the virus.  Increase the usage of your inhaler if you have asthma.   Do not use any tobacco products, including cigarettes, chewing tobacco, or electronic cigarettes. If you need help quitting, ask your health care  provider. PREVENTION  The best way to protect yourself from getting a cold is to practice good hygiene.   Avoid oral or hand contact with people with cold symptoms.   Wash your hands often if contact occurs.  There is no clear evidence that vitamin C, vitamin E, echinacea, or exercise reduces the chance of developing a cold. However, it is always recommended to get plenty of rest, exercise, and practice good nutrition.  SEEK MEDICAL CARE IF:   You are getting worse  rather than better.   Your symptoms are not controlled by medicine.   You have chills.  You have worsening shortness of breath.  You have brown or red mucus.  You have yellow or brown nasal discharge.  You have pain in your face, especially when you bend forward.  You have a fever.  You have swollen neck glands.  You have pain while swallowing.  You have white areas in the back of your throat. SEEK IMMEDIATE MEDICAL CARE IF:   You have severe or persistent:  Headache.  Ear pain.  Sinus pain.  Chest pain.  You have chronic lung disease and any of the following:  Wheezing.  Prolonged cough.  Coughing up blood.  A change in your usual mucus.  You have a stiff neck.  You have changes in your:  Vision.  Hearing.  Thinking.  Mood. MAKE SURE YOU:   Understand these instructions.  Will watch your condition.  Will get help right away if you are not doing well or get worse.   This information is not intended to replace advice given to you by your health care provider. Make sure you discuss any questions you have with your health care provider.   Document Released: 04/07/2001 Document Revised: 02/26/2015 Document Reviewed: 01/17/2014 Elsevier Interactive Patient Education [employer]

## 2015-12-07 ENCOUNTER — Encounter (HOSPITAL_COMMUNITY): Payer: Self-pay | Admitting: Emergency Medicine

## 2015-12-07 ENCOUNTER — Emergency Department (HOSPITAL_COMMUNITY)
Admission: EM | Admit: 2015-12-07 | Discharge: 2015-12-07 | Disposition: A | Discharge status: Home / Self Care | Payer: Managed Care, Other (non HMO) | Attending: Emergency Medicine | Admitting: Emergency Medicine

## 2015-12-07 ENCOUNTER — Emergency Department (HOSPITAL_COMMUNITY): Payer: Managed Care, Other (non HMO)

## 2015-12-07 DIAGNOSIS — Z87891 Personal history of nicotine dependence: Secondary | ICD-10-CM | POA: Insufficient documentation

## 2015-12-07 DIAGNOSIS — R079 Chest pain, unspecified: Secondary | ICD-10-CM | POA: Diagnosis not present

## 2015-12-07 LAB — BASIC METABOLIC PANEL
ANION GAP: 14 (ref 5–15)
BUN: 14 mg/dL (ref 6–20)
CHLORIDE: 101 mmol/L (ref 101–111)
CO2: 26 mmol/L (ref 22–32)
CREATININE: 0.78 mg/dL (ref 0.44–1.00)
Calcium: 10.4 mg/dL — ABNORMAL HIGH (ref 8.9–10.3)
Glucose, Bld: 112 mg/dL — ABNORMAL HIGH (ref 65–99)
Potassium: 4.4 mmol/L (ref 3.5–5.1)
SODIUM: 141 mmol/L (ref 135–145)

## 2015-12-07 LAB — CBC
HCT: 40.9 % (ref 36.0–46.0)
HEMOGLOBIN: 13.9 g/dL (ref 12.0–15.0)
MCH: 32.9 pg (ref 26.0–34.0)
MCHC: 34 g/dL (ref 30.0–36.0)
MCV: 96.7 fL (ref 78.0–100.0)
PLATELETS: 258 10*3/uL (ref 150–400)
RBC: 4.23 MIL/uL (ref 3.87–5.11)
RDW: 11.9 % (ref 11.5–15.5)
WBC: 6.4 10*3/uL (ref 4.0–10.5)

## 2015-12-07 LAB — I-STAT TROPONIN, ED
TROPONIN I, POC: 0 ng/mL (ref 0.00–0.08)
TROPONIN I, POC: 0 ng/mL (ref 0.00–0.08)

## 2015-12-07 LAB — D-DIMER, QUANTITATIVE (NOT AT ARMC): D DIMER QUANT: 0.34 ug{FEU}/mL (ref 0.00–0.50)

## 2015-12-07 MED ORDER — NAPROXEN 250 MG PO TABS
500.0000 mg | ORAL_TABLET | Freq: Once | ORAL | Status: AC
  Administered 2015-12-07: 500 mg via ORAL
  Filled 2015-12-07: qty 2

## 2015-12-07 MED ORDER — NAPROXEN 500 MG PO TABS: 500.0000 mg | ORAL_TABLET | Freq: Two times a day (BID) | ORAL | Status: DC
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
## 2015-12-07 NOTE — ED Provider Notes (Signed)
CHIEF COMPLAINT: Chest pain  HPI: Pt is a 60 y.o. female with history of tobacco use who quit in 2005 who presents emergency department with sharp left-sided chest pain without radiation that started at 10 PM. Pain worse with inspiration and palpation. Also reports she has had chronic shortness of breath for many months now that's unchanged. No fevers or cough. No nausea, vomiting. No diaphoresis or dizziness. Pain is not exertional. No history of stress test or cardiac catheterization. She denies history of hypertension, diabetes, hyperlipidemia. She does have a family history of CAD. No history of PE, DVT, exogenous estrogen use, fracture, surgery, trauma, hospitalization, prolonged travel. No lower extremity swelling or pain. No calf tenderness.   ROS: See HPI Constitutional: no fever  Eyes: no drainage  ENT: no runny nose   Cardiovascular:   chest pain  Resp:  SOB  GI: no vomiting GU: no dysuria Integumentary: no rash  Allergy: no hives  Musculoskeletal: no leg swelling  Neurological: no slurred speech ROS otherwise negative  PAST MEDICAL HISTORY/PAST SURGICAL HISTORY:  History reviewed. No pertinent past medical history.  MEDICATIONS:  Prior to Admission medications   Medication Sig Start Date End Date Taking? Authorizing Provider  benzonatate (TESSALON) 200 MG capsule Take 1 capsule (200 mg total) by mouth 3 (three) times daily as needed for cough. Patient not taking: Reported on 12/07/2015 10/10/14   Reuben Likes, MD  ipratropium (ATROVENT) 0.06 % nasal spray Place 2 sprays into both nostrils 4 (four) times daily. Patient not taking: Reported on 12/07/2015 10/10/14   Reuben Likes, MD  metroNIDAZOLE (FLAGYL) 500 MG tablet Take 1 tablet (500 mg total) by mouth 2 (two) times daily. Patient not taking: Reported on 12/07/2015 02/25/13   Azalia Bilis, MD  predniSONE (DELTASONE) 20 MG tablet Take 1 tablet (20 mg total) by mouth 2 (two) times daily. Patient not taking: Reported on  12/07/2015 10/10/14   Reuben Likes, MD    ALLERGIES:  Allergies  Allergen Reactions  . Hydrocodone Nausea And Vomiting and Other (See Comments)    "hallucinations"     SOCIAL HISTORY:  Social History  Substance Use Topics  . Smoking status: Former Games developer  . Smokeless tobacco: Not on file  . Alcohol Use: No    FAMILY HISTORY: History reviewed. No pertinent family history.  EXAM: BP 114/90 mmHg  Pulse 78  Temp(Src) 97.7 F (36.5 C) (Oral)  Resp 15  SpO2 100% CONSTITUTIONAL: Alert and oriented and responds appropriately to questions. Well-appearing; well-nourished HEAD: Normocephalic EYES: Conjunctivae clear, PERRL ENT: normal nose; no rhinorrhea; moist mucous membranes; pharynx without lesions noted NECK: Supple, no meningismus, no LAD  CARD: RRR; S1 and S2 appreciated; no murmurs, no clicks, no rubs, no gallops CHEST:  Tender to palpation over her left chest wall underneath her breast. No breast masses appreciated. Nipple appears normal and is not inverted. No nipple discharge. No ecchymosis, deformity, crepitus or rash. RESP: Normal chest excursion without splinting or tachypnea; breath sounds clear and equal bilaterally; no wheezes, no rhonchi, no rales, no hypoxia or respiratory distress, speaking full sentences ABD/GI: Normal bowel sounds; non-distended; soft, non-tender, no rebound, no guarding, no peritoneal signs BACK:  The back appears normal and is non-tender to palpation, there is no CVA tenderness EXT: Normal ROM in all joints; non-tender to palpation; no edema; normal capillary refill; no cyanosis, no calf tenderness or swelling    SKIN: Normal color for age and race; warm NEURO: Moves all extremities equally, sensation to light  touch intact diffusely, cranial nerves II through XII intact PSYCH: The patient's mood and manner are appropriate. Grooming and personal hygiene are appropriate.  MEDICAL DECISION MAKING: Patient here with what appears to be atypical  chest pain. First troponin negative. Chest x-ray clear. Given his pleuritic will obtain d-dimer and repeat second troponin several hours after first. We'll give naproxen for pain and reassess.  ED PROGRESS: Patient's second troponin is negative. D-dimer negative. Patient is pain-free after naproxen. I suspect that this is musculoskeletal in nature and I feel she is safe to be discharged with outpatient follow-up. Discussed return precautions. We'll discharge with prescription for naproxen to take at home as needed. She verbalized understanding and is comfortable with this plan.    EKG Interpretation  Date/Time:  Saturday December 07 2015 00:56:30 EST Ventricular Rate:  91 PR Interval:  170 QRS Duration: 56 QT Interval:  362 QTC Calculation: 445 R Axis:   58 Text Interpretation:  Normal sinus rhythm Septal infarct , age undetermined Abnormal ECG No significant change since last tracing Confirmed by WARD,  DO, KRISTEN [phone]) on 12/07/2015 5:27:32 AM        Layla Maw Ward, DO 12/07/15 5366

## 2015-12-07 NOTE — Discharge Instructions (Signed)
Nonspecific Chest Pain  °Chest pain can be caused by many different conditions. There is always a chance that your pain could be related to something serious, such as a heart attack or a blood clot in your lungs. Chest pain can also be caused by conditions that are not life-threatening. If you have chest pain, it is very important to follow up with your health care provider. °CAUSES  °Chest pain can be caused by: °· Heartburn. °· Pneumonia or bronchitis. °· Anxiety or stress. °· Inflammation around your heart (pericarditis) or lung (pleuritis or pleurisy). °· A blood clot in your lung. °· A collapsed lung (pneumothorax). It can develop suddenly on its own (spontaneous pneumothorax) or from trauma to the chest. °· Shingles infection (varicella-zoster virus). °· Heart attack. °· Damage to the bones, muscles, and cartilage that make up your chest wall. This can include: °¨ Bruised bones due to injury. °¨ Strained muscles or cartilage due to frequent or repeated coughing or overwork. °¨ Fracture to one or more ribs. °¨ Sore cartilage due to inflammation (costochondritis). °RISK FACTORS  °Risk factors for chest pain may include: °· Activities that increase your risk for trauma or injury to your chest. °· Respiratory infections or conditions that cause frequent coughing. °· Medical conditions or overeating that can cause heartburn. °· Heart disease or family history of heart disease. °· Conditions or health behaviors that increase your risk of developing a blood clot. °· Having had chicken pox (varicella zoster). °SIGNS AND SYMPTOMS °Chest pain can feel like: °· Burning or tingling on the surface of your chest or deep in your chest. °· Crushing, pressure, aching, or squeezing pain. °· Dull or sharp pain that is worse when you move, cough, or take a deep breath. °· Pain that is also felt in your back, neck, shoulder, or arm, or pain that spreads to any of these areas. °Your chest pain may come and go, or it may stay  constant. °DIAGNOSIS °Lab tests or other studies may be needed to find the cause of your pain. Your health care provider may have you take a test called an ambulatory ECG (electrocardiogram). An ECG records your heartbeat patterns at the time the test is performed. You may also have other tests, such as: °· Transthoracic echocardiogram (TTE). During echocardiography, sound waves are used to create a picture of all of the heart structures and to look at how blood flows through your heart. °· Transesophageal echocardiogram (TEE). This is a more advanced imaging test that obtains images from inside your body. It allows your health care provider to see your heart in finer detail. °· Cardiac monitoring. This allows your health care provider to monitor your heart rate and rhythm in real time. °· Holter monitor. This is a portable device that records your heartbeat and can help to diagnose abnormal heartbeats. It allows your health care provider to track your heart activity for several days, if needed. °· Stress tests. These can be done through exercise or by taking medicine that makes your heart beat more quickly. °· Blood tests. °· Imaging tests. °TREATMENT  °Your treatment depends on what is causing your chest pain. Treatment may include: °· Medicines. These may include: °¨ Acid blockers for heartburn. °¨ Anti-inflammatory medicine. °¨ Pain medicine for inflammatory conditions. °¨ Antibiotic medicine, if an infection is present. °¨ Medicines to dissolve blood clots. °¨ Medicines to treat coronary artery disease. °· Supportive care for conditions that do not require medicines. This may include: °¨ Resting. °¨ Applying heat   or cold packs to injured areas. °¨ Limiting activities until pain decreases. °HOME CARE INSTRUCTIONS °· If you were prescribed an antibiotic medicine, finish it all even if you start to feel better. °· Avoid any activities that bring on chest pain. °· Do not use any tobacco products, including  cigarettes, chewing tobacco, or electronic cigarettes. If you need help quitting, ask your health care provider. °· Do not drink alcohol. °· Take medicines only as directed by your health care provider. °· Keep all follow-up visits as directed by your health care provider. This is important. This includes any further testing if your chest pain does not go away. °· If heartburn is the cause for your chest pain, you may be told to keep your head raised (elevated) while sleeping. This reduces the chance that acid will go from your stomach into your esophagus. °· Make lifestyle changes as directed by your health care provider. These may include: °¨ Getting regular exercise. Ask your health care provider to suggest some activities that are safe for you. °¨ Eating a heart-healthy diet. A registered dietitian can help you to learn healthy eating options. °¨ Maintaining a healthy weight. °¨ Managing diabetes, if necessary. °¨ Reducing stress. °SEEK MEDICAL CARE IF: °· Your chest pain does not go away after treatment. °· You have a rash with blisters on your chest. °· You have a fever. °SEEK IMMEDIATE MEDICAL CARE IF:  °· Your chest pain is worse. °· You have an increasing cough, or you cough up blood. °· You have severe abdominal pain. °· You have severe weakness. °· You faint. °· You have chills. °· You have sudden, unexplained chest discomfort. °· You have sudden, unexplained discomfort in your arms, back, neck, or jaw. °· You have shortness of breath at any time. °· You suddenly start to sweat, or your skin gets clammy. °· You feel nauseous or you vomit. °· You suddenly feel light-headed or dizzy. °· Your heart begins to beat quickly, or it feels like it is skipping beats. °These symptoms may represent a serious problem that is an emergency. Do not wait to see if the symptoms will go away. Get medical help right away. Call your local emergency services (911 in the [place] Do not drive yourself to the hospital. °  °This  information is not intended to replace advice given to you by your health care provider. Make sure you discuss any questions you have with your health care provider. °  °Document Released: 07/22/2005 Document Revised: 11/02/2014 Document Reviewed: 05/18/2014 °Elsevier Interactive Patient Education ©2016 [employer] ° °

## 2016-10-20 ENCOUNTER — Other Ambulatory Visit: Payer: Self-pay | Admitting: Rehabilitation

## 2016-10-20 DIAGNOSIS — M5126 Other intervertebral disc displacement, lumbar region: Secondary | ICD-10-CM

## 2016-10-24 ENCOUNTER — Ambulatory Visit
Admission: RE | Admit: 2016-10-24 | Discharge: 2016-10-24 | Disposition: A | Discharge status: Home / Self Care | Payer: Managed Care, Other (non HMO) | Source: Ambulatory Visit | Attending: Rehabilitation | Admitting: Rehabilitation

## 2016-10-24 DIAGNOSIS — M5126 Other intervertebral disc displacement, lumbar region: Secondary | ICD-10-CM

## 2018-02-03 ENCOUNTER — Encounter (HOSPITAL_COMMUNITY): Payer: Self-pay | Admitting: Emergency Medicine

## 2018-02-03 ENCOUNTER — Emergency Department (HOSPITAL_COMMUNITY): Payer: Managed Care, Other (non HMO)

## 2018-02-03 ENCOUNTER — Other Ambulatory Visit: Payer: Self-pay

## 2018-02-03 ENCOUNTER — Emergency Department (HOSPITAL_COMMUNITY)
Admission: EM | Admit: 2018-02-03 | Discharge: 2018-02-03 | Disposition: A | Discharge status: Home / Self Care | Payer: Managed Care, Other (non HMO) | Attending: Emergency Medicine | Admitting: Emergency Medicine

## 2018-02-03 DIAGNOSIS — Z79899 Other long term (current) drug therapy: Secondary | ICD-10-CM | POA: Diagnosis not present

## 2018-02-03 DIAGNOSIS — Z87891 Personal history of nicotine dependence: Secondary | ICD-10-CM | POA: Diagnosis not present

## 2018-02-03 DIAGNOSIS — R079 Chest pain, unspecified: Secondary | ICD-10-CM | POA: Insufficient documentation

## 2018-02-03 LAB — I-STAT TROPONIN, ED
Troponin i, poc: 0 ng/mL (ref 0.00–0.08)
Troponin i, poc: 0 ng/mL (ref 0.00–0.08)

## 2018-02-03 LAB — CBC
HCT: 41.2 % (ref 36.0–46.0)
Hemoglobin: 14.4 g/dL (ref 12.0–15.0)
MCH: 33.6 pg (ref 26.0–34.0)
MCHC: 35 g/dL (ref 30.0–36.0)
MCV: 96 fL (ref 78.0–100.0)
Platelets: 256 10*3/uL (ref 150–400)
RBC: 4.29 MIL/uL (ref 3.87–5.11)
RDW: 12.1 % (ref 11.5–15.5)
WBC: 5.7 10*3/uL (ref 4.0–10.5)

## 2018-02-03 LAB — BASIC METABOLIC PANEL
Anion gap: 12 (ref 5–15)
BUN: 10 mg/dL (ref 6–20)
CALCIUM: 9.7 mg/dL (ref 8.9–10.3)
CO2: 22 mmol/L (ref 22–32)
Chloride: 105 mmol/L (ref 101–111)
Creatinine, Ser: 0.75 mg/dL (ref 0.44–1.00)
GFR calc Af Amer: >60 mL/min (ref >60)
GLUCOSE: 109 mg/dL — AB (ref 65–99)
Potassium: 3.3 mmol/L — ABNORMAL LOW (ref 3.5–5.1)
Sodium: 139 mmol/L (ref 135–145)

## 2018-02-03 NOTE — ED Provider Notes (Signed)
[facility] EMERGENCY DEPARTMENT Provider Note   CSN: [id number] Arrival date & time: 02/03/18  0455     History   Chief Complaint Chief Complaint  Patient presents with  . Chest Pain    HPI Robin Clark is a 62 y.o. female with no significant past medical history who presents for evaluation of chest pain that began at approximately 9 PM last night.  Patient reports that the chest pain is in the left lower chest and describes it as a sharp pain.  She states the pain does not radiate.  She reports the pain is not worse with deep inspiration or exertion.  She had no associated nausea, vomiting, diaphoresis.  Patient reports that she does have some associated shortness of breath.  Patient reports that she took 3 aspirin for pain with minimal improvement.  She denies any preceding trauma, injury, fall.  Patient does report that yesterday, she walked on a treadmill with an incline yesterday.  She did not have any chest pain while doing so but states that this is the first time she had used incline.  On ED arrival, patient reports chest pain has resolved.  Patient reports that she still feels "like she is breathing shallow"but states improvement in shortness of breath.  Patient reports that she normally does the treadmill daily.  Patient reports no personal cardiac history.  She does report that her brother had a heart attack at age 85.  She does report that her mom had a history of CAD and reports several other family members with CAD.  Patient reports she is not a current smoker. She denies any OCP use, recent immobilization, prior history of DVT/PE, recent surgery, leg swelling, or long travel.   The history is provided by the patient.    History reviewed. No pertinent past medical history.  There are no active problems to display for this patient.   Past Surgical History:  Procedure Laterality Date  . ABDOMINAL HYSTERECTOMY       OB History   None      Home  Medications    Prior to Admission medications   Medication Sig Start Date End Date Taking? Authorizing Provider  naproxen (NAPROSYN) 500 MG tablet Take 1 tablet (500 mg total) by mouth 2 (two) times daily with a meal. As needed for pain 12/07/15   Ward, Baxter Hire N, DO  ipratropium (ATROVENT) 0.06 % nasal spray Place 2 sprays into both nostrils 4 (four) times daily. Patient not taking: Reported on 12/07/2015 10/10/14 12/07/15  Reuben Likes, MD    Family History No family history on file.  Social History Social History   Tobacco Use  . Smoking status: Former Smoker  Substance Use Topics  . Alcohol use: No  . Drug use: No     Allergies   Hydrocodone   Review of Systems Review of Systems  Constitutional: Negative for chills and fever.  Respiratory: Positive for shortness of breath. Negative for cough.   Cardiovascular: Positive for chest pain. Negative for leg swelling.  Gastrointestinal: Negative for abdominal pain, diarrhea, nausea and vomiting.  Musculoskeletal: Negative for back pain and neck pain.  Skin: Negative for rash.  Neurological: Negative for dizziness, weakness, numbness and headaches.  All other systems reviewed and are negative.    Physical Exam Updated Vital Signs BP 115/76 (BP Location: Right Arm)   Pulse 66   Temp 97.9 F (36.6 C) (Oral)   Resp 18   LMP 01/13/2018   SpO2 100%  Physical Exam  Constitutional: She is oriented to person, place, and time. She appears well-developed and well-nourished.  Sitting comfortably on examination table  HENT:  Head: Normocephalic and atraumatic.  Mouth/Throat: Oropharynx is clear and moist and mucous membranes are normal.  Eyes: Pupils are equal, round, and reactive to light. Conjunctivae, EOM and lids are normal.  Neck: Full passive range of motion without pain.  Cardiovascular: Normal rate, regular rhythm, normal heart sounds and normal pulses. Exam reveals no gallop and no friction rub.  No murmur  heard. Pulses:      Radial pulses are 2+ on the right side, and 2+ on the left side.       Dorsalis pedis pulses are 2+ on the right side, and 2+ on the left side.  Equal pulses in all 4 extremities.   Pulmonary/Chest: Effort normal and breath sounds normal.  No evidence of respiratory distress. Able to speak in full sentences without difficulty.  No tenderness palpation to anterior chest wall.  Pain is not reproduced with palpation or movement of the extremities.  Abdominal: Soft. Normal appearance. There is no tenderness. There is no rigidity and no guarding.  Musculoskeletal: Normal range of motion.  BLE are symmetric in appearance without any overlying warmth, edema, erythema.  Neurological: She is alert and oriented to person, place, and time.  Skin: Skin is warm and dry. Capillary refill takes less than 2 seconds.  Psychiatric: Her speech is normal. Her mood appears anxious.  Nursing note and vitals reviewed.    ED Treatments / Results  Labs (all labs ordered are listed, but only abnormal results are displayed) Labs Reviewed  BASIC METABOLIC PANEL - Abnormal; Notable for the following components:      Result Value   Potassium 3.3 (*)    Glucose, Bld 109 (*)    All other components within normal limits  CBC  I-STAT TROPONIN, ED  I-STAT TROPONIN, ED    EKG EKG Interpretation  Date/Time:  Thursday February 03 2018 07:59:39 EDT Ventricular Rate:  70 PR Interval:    QRS Duration: 90 QT Interval:  422 QTC Calculation: 456 R Axis:   28 Text Interpretation:  Sinus rhythm Atrial premature complex Low voltage, precordial leads Confirmed by Kristine Royal [phone]) on 02/03/2018 8:02:02 AM   Radiology Dg Chest 2 View  Result Date: 02/03/2018 CLINICAL DATA:  Mid chest pain. EXAM: CHEST - 2 VIEW COMPARISON:  Radiograph 12/07/2015 FINDINGS: The cardiomediastinal contours are normal. The lungs are clear. Pulmonary vasculature is normal. No consolidation, pleural effusion, or  pneumothorax. No acute osseous abnormalities are seen. IMPRESSION: No acute pulmonary process. Electronically Signed   By: Rubye Oaks M.D.   On: 02/03/2018 06:08    Procedures Procedures (including critical care time)  Medications Ordered in ED Medications - No data to display   Initial Impression / Assessment and Plan / ED Course  I have reviewed the triage vital signs and the nursing notes.  Pertinent labs & imaging results that were available during my care of the patient were reviewed by me and considered in my medical decision making (see chart for details).     62 year old female who presents for evaluation of chest pain that began last night approximately 9 PM.  Associate with some mild shortness of breath.  Chest pain not worse with exertion or deep inspiration.  No nausea, vomiting, diaphoresis.  Patient does not smoke.  No personal cardiac history.  Reports brother had a heart attack at 45 and mom has  history of CAD.  Has never been evaluated by cardiology. Patient is afebrile, non-toxic appearing, sitting comfortably on examination table. Vital signs reviewed and stable.  On my evaluation, patient reports she is currently not having any chest pain.  Patient states shortness of breath has improved but she states she is "breathing shallow."  Consider ACS etiology versus muscle skeletal injury versus anxiety.  Exam is not concerning for PE.  Patient does not have any PE risk factors.  Vital signs show patient is not hypoxic or toxic tachycardic. Patient is very anxious on my evaluation.  I suspect that the shallow breathing is more likely secondary to anxiety rather than shortness of breath.  Initial labs ordered at triage.  BMP shows potassium of 3.3.  Otherwise unremarkable.  CBC shows no leukocytosis, anemia.  Chest x-ray negative for any acute infectious etiology.  Initial troponin is negative.  Given history/physical exam, patient has a HEART score of 3. Will plan for delta  troponin.   Delta troponin negative.  Discussed results with patient.  Patient is currently denying any pain or shortness of breath at this time.  Vital signs are stable.  Given that pain started at approximately 9 PM last night, patient is pain-free and has had 2- troponins here in the ED, patient can be safely discharged home with outpatient cardiology follow-up. Discussed patient with Dr. Rodena MedinMessick who agreed with plan.  Patient feels comfortable with the plan.  Instructed patient to contact cardiology to arrange for an outpatient evaluation.  Patient instructed to follow-up with primary care doctor. Patient had ample opportunity for questions and discussion. All patient's questions were answered with full understanding. Strict return precautions discussed. Patient expresses understanding and agreement to plan.   Final Clinical Impressions(s) / ED Diagnoses   Final diagnoses:  Chest pain, unspecified type    ED Discharge Orders    None       Maxwell CaulLayden, Ludwig Tugwell A, PA-C 02/03/18 0931    Wynetta FinesMessick, Peter C, MD 02/03/18 [phone]

## 2018-02-03 NOTE — ED Triage Notes (Signed)
Patient reports intermittent left lower chest pain onset last night with mild SOB , denies nausea or diaphoresis , pt. stated she walked inclined thread mill yesterday , no fall or injury.

## 2018-02-03 NOTE — Discharge Instructions (Addendum)
As we discussed, your workup today was reassuring.  We do want you to follow-up with a cardiologist to evaluate your symptoms.  Call the referred cardiologist in the paperwork and arrange for an appointment.  Return to the Emergency Department immediately if you experiencing worsening chest pain, difficulty breathing, nausea/vomiting, get very sweaty, headache or any other worsening or concerning symptoms.

## 2018-07-11 ENCOUNTER — Other Ambulatory Visit (HOSPITAL_COMMUNITY)
Admission: RE | Admit: 2018-07-11 | Discharge: 2018-07-11 | Disposition: A | Discharge status: Home / Self Care | Payer: Managed Care, Other (non HMO) | Source: Ambulatory Visit | Attending: Family Medicine | Admitting: Family Medicine

## 2018-07-11 ENCOUNTER — Other Ambulatory Visit: Payer: Self-pay | Admitting: Family Medicine

## 2018-07-11 DIAGNOSIS — Z01411 Encounter for gynecological examination (general) (routine) with abnormal findings: Secondary | ICD-10-CM | POA: Diagnosis not present

## 2018-07-12 LAB — CYTOLOGY - PAP
Diagnosis: NEGATIVE
HPV (WINDOPATH): NOT DETECTED

## 2018-07-13 ENCOUNTER — Other Ambulatory Visit: Payer: Self-pay | Admitting: Family Medicine

## 2018-07-13 DIAGNOSIS — Z1231 Encounter for screening mammogram for malignant neoplasm of breast: Secondary | ICD-10-CM

## 2018-07-27 ENCOUNTER — Ambulatory Visit
Admission: RE | Admit: 2018-07-27 | Discharge: 2018-07-27 | Disposition: A | Discharge status: Home / Self Care | Payer: Managed Care, Other (non HMO) | Source: Ambulatory Visit | Attending: Family Medicine | Admitting: Family Medicine

## 2018-07-27 DIAGNOSIS — Z1231 Encounter for screening mammogram for malignant neoplasm of breast: Secondary | ICD-10-CM

## 2019-02-01 ENCOUNTER — Encounter (HOSPITAL_COMMUNITY): Payer: Self-pay

## 2019-02-01 ENCOUNTER — Other Ambulatory Visit: Payer: Self-pay

## 2019-02-01 ENCOUNTER — Emergency Department (HOSPITAL_COMMUNITY): Payer: Managed Care, Other (non HMO)

## 2019-02-01 ENCOUNTER — Emergency Department (HOSPITAL_COMMUNITY)
Admission: EM | Admit: 2019-02-01 | Discharge: 2019-02-01 | Disposition: A | Discharge status: Home / Self Care | Payer: Managed Care, Other (non HMO) | Attending: Emergency Medicine | Admitting: Emergency Medicine

## 2019-02-01 DIAGNOSIS — R0602 Shortness of breath: Secondary | ICD-10-CM | POA: Diagnosis not present

## 2019-02-01 DIAGNOSIS — R0789 Other chest pain: Secondary | ICD-10-CM | POA: Insufficient documentation

## 2019-02-01 LAB — TROPONIN I
Troponin I: <0.03 ng/mL (ref <0.03)
Troponin I: <0.03 ng/mL (ref <0.03)

## 2019-02-01 LAB — CBC WITH DIFFERENTIAL/PLATELET
Abs Immature Granulocytes: 0.02 10*3/uL (ref 0.00–0.07)
Basophils Absolute: 0 10*3/uL (ref 0.0–0.1)
Basophils Relative: 1 %
Eosinophils Absolute: 0.1 10*3/uL (ref 0.0–0.5)
Eosinophils Relative: 1 %
HCT: 39.6 % (ref 36.0–46.0)
Hemoglobin: 13.2 g/dL (ref 12.0–15.0)
Immature Granulocytes: 0 %
Lymphocytes Relative: 18 %
Lymphs Abs: 1.1 10*3/uL (ref 0.7–4.0)
MCH: 33 pg (ref 26.0–34.0)
MCHC: 33.3 g/dL (ref 30.0–36.0)
MCV: 99 fL (ref 80.0–100.0)
Monocytes Absolute: 0.5 10*3/uL (ref 0.1–1.0)
Monocytes Relative: 8 %
Neutro Abs: 4.3 10*3/uL (ref 1.7–7.7)
Neutrophils Relative %: 72 %
Platelets: 269 10*3/uL (ref 150–400)
RBC: 4 MIL/uL (ref 3.87–5.11)
RDW: 11.9 % (ref 11.5–15.5)
WBC: 6 10*3/uL (ref 4.0–10.5)
nRBC: 0 % (ref 0.0–0.2)

## 2019-02-01 LAB — BASIC METABOLIC PANEL
Anion gap: 14 (ref 5–15)
BUN: 11 mg/dL (ref 8–23)
CO2: 20 mmol/L — ABNORMAL LOW (ref 22–32)
Calcium: 9.7 mg/dL (ref 8.9–10.3)
Chloride: 105 mmol/L (ref 98–111)
Creatinine, Ser: 0.69 mg/dL (ref 0.44–1.00)
GFR calc Af Amer: >60 mL/min (ref >60)
GFR calc non Af Amer: >60 mL/min (ref >60)
Glucose, Bld: 146 mg/dL — ABNORMAL HIGH (ref 70–99)
Potassium: 3.3 mmol/L — ABNORMAL LOW (ref 3.5–5.1)
Sodium: 139 mmol/L (ref 135–145)

## 2019-02-01 MED ORDER — POTASSIUM CHLORIDE CRYS ER 20 MEQ PO TBCR
40.0000 meq | EXTENDED_RELEASE_TABLET | Freq: Once | ORAL | Status: AC
  Administered 2019-02-01: 06:00:00 40 meq via ORAL
  Filled 2019-02-01 (×2): qty 2

## 2019-02-01 MED ORDER — NAPROXEN 250 MG PO TABS
500.0000 mg | ORAL_TABLET | Freq: Once | ORAL | Status: AC
  Administered 2019-02-01: 500 mg via ORAL
  Filled 2019-02-01: qty 2

## 2019-02-01 NOTE — ED Triage Notes (Signed)
Pt arrived via [facility]; pt from [facility] with c/o SOB and chills x 2 days; pt denies any recent travel; however has suffered recent losses of husband; she also loss a co-worker, who died of PNA, which she states scares her more about her symptoms. 97.5, 100, 151/92, 16; no CBG

## 2019-02-01 NOTE — Discharge Instructions (Signed)
Please follow up with your doctor for stress testing  Return if you are worsening

## 2019-02-01 NOTE — ED Provider Notes (Signed)
  Physical Exam  BP (!) 144/92   Pulse 92   Temp 98.5 F (36.9 C) (Oral)   Resp (!) 29   Ht 5\' 4"  (1.626 m)   Wt 76.7 kg   LMP 01/13/2018   SpO2 100%   BMI 29.01 kg/m   Physical Exam  ED Course/Procedures   Clinical Course as of Feb 01 835  Wed Feb 01, 2019  0716 BUN: 11 [KM]  [phone] Patient remained stable and well in ED. Waiting on second trop at this time   [KM]  0836 Trop negative x2. Patient to f/u as scheduled this week for stress test.    [KM]    Clinical Course User Index [KM] Arlyn Dunning, PA-C    Procedures  MDM  Patient handed to me after shift change.  63 year old female with history of chronic chest pain and shortness of breath.  Comes to the emergency department for the same today.  Has follow-up for stress test this week.  Stable here while in the ED.  Patient concerned because she has a coworker who died of pneumonia and also has recently lost her husband.      Arlyn Dunning, PA-C 02/01/19 1638    Charlynne Pander, MD 02/02/19 1116

## 2019-02-01 NOTE — ED Provider Notes (Signed)
[facility] EMERGENCY DEPARTMENT Provider Note   CSN: [id number] Arrival date & time: 02/01/19  0354    History   Chief Complaint Chief Complaint  Patient presents with  . Shortness of Breath    w/ chills    HPI Robin Clark is a 63 y.o. female who presents with shortness of breath.  No significant past medical history.  Patient states that tonight she was having some palpitations and felt more short of breath than usual so she decided to come to the ED.  She reports intermittent shortness of breath for several months.  She states that she is supposed to have stress testing at [facility] but due to the coronavirus pandemic this was delayed.  Additionally she reports being under quite a bit of stress.  Her ex-husband recently died from cancer and she had a coworker who passed due to COVID-19.  She feels short of breath at random times but seems to be worse with talking and walking.  She was able to walk with her friend a mile today but reports feeling fatigued afterwards.  She denies any fever, coughing, wheezing, chest pain, abdominal pain, nausea, vomiting, leg swelling. No recent surgery/travel/immobilization, hx of cancer, hemoptysis, prior DVT/PE, or hormone use.  She is a former smoker.  She does have intermittent sharp left-sided chest pain at times which is non-exertional. Of note she was seen in April 2019 and Feb 2017 with similar symptoms and had an overall reassuring work up both times with normal troponins and negative d-dimer.   HPI  History reviewed. No pertinent past medical history.  There are no active problems to display for this patient.   Past Surgical History:  Procedure Laterality Date  . ABDOMINAL HYSTERECTOMY       OB History   No obstetric history on file.      Home Medications    Prior to Admission medications   Medication Sig Start Date End Date Taking? Authorizing Provider  naproxen (NAPROSYN) 500 MG tablet Take 1  tablet (500 mg total) by mouth 2 (two) times daily with a meal. As needed for pain 12/07/15   Ward, Layla Maw, DO    Family History History reviewed. No pertinent family history.  Social History Social History   Tobacco Use  . Smoking status: Former Smoker  Substance Use Topics  . Alcohol use: No  . Drug use: No     Allergies   Hydrocodone   Review of Systems Review of Systems  Constitutional: Positive for chills. Negative for fever.  HENT: Positive for rhinorrhea.   Respiratory: Positive for shortness of breath. Negative for cough and wheezing.   Cardiovascular: Positive for chest pain and palpitations. Negative for leg swelling.  Gastrointestinal: Negative for abdominal pain, nausea and vomiting.  All other systems reviewed and are negative.    Physical Exam Updated Vital Signs BP (!) 144/92   Pulse 92   Temp 98.5 F (36.9 C) (Oral)   Resp (!) 29   Ht 5\' 4"  (1.626 m)   Wt 76.7 kg   LMP 01/13/2018   SpO2 100%   BMI 29.01 kg/m   Physical Exam Vitals signs and nursing note reviewed.  Constitutional:      General: She is not in acute distress.    Appearance: She is well-developed. She is not ill-appearing.     Comments: Calm and cooperative. Well appearing  HENT:     Head: Normocephalic and atraumatic.  Eyes:     General:  No scleral icterus.       Right eye: No discharge.        Left eye: No discharge.     Conjunctiva/sclera: Conjunctivae normal.     Pupils: Pupils are equal, round, and reactive to light.  Neck:     Musculoskeletal: Normal range of motion.  Cardiovascular:     Rate and Rhythm: Normal rate and regular rhythm.  Pulmonary:     Effort: Pulmonary effort is normal. No respiratory distress.     Breath sounds: Normal breath sounds.  Abdominal:     General: There is no distension.     Palpations: Abdomen is soft.     Tenderness: There is no abdominal tenderness.  Musculoskeletal:     Right lower leg: No edema.     Left lower leg: No edema.   Skin:    General: Skin is warm and dry.  Neurological:     Mental Status: She is alert and oriented to person, place, and time.  Psychiatric:        Behavior: Behavior normal.      ED Treatments / Results  Labs (all labs ordered are listed, but only abnormal results are displayed) Labs Reviewed  BASIC METABOLIC PANEL - Abnormal; Notable for the following components:      Result Value   Potassium 3.3 (*)    CO2 20 (*)    Glucose, Bld 146 (*)    All other components within normal limits  CBC WITH DIFFERENTIAL/PLATELET  TROPONIN I  TROPONIN I    EKG EKG Interpretation  Date/Time:  Wednesday February 01 2019 04:05:16 EDT Ventricular Rate:  89 PR Interval:    QRS Duration: 76 QT Interval:  367 QTC Calculation: 447 R Axis:   31 Text Interpretation:  Sinus rhythm No significant change since last tracing Confirmed by Rochele RaringWard, Kristen [phone]([id number]) on 02/01/2019 4:17:48 AM   Radiology Dg Chest Port 1 View  Result Date: 02/01/2019 CLINICAL DATA:  Shortness of breath EXAM: PORTABLE CHEST 1 VIEW COMPARISON:  02/03/2018 FINDINGS: Normal heart size and mediastinal contours. No acute infiltrate or edema. No effusion or pneumothorax. No acute osseous findings. IMPRESSION: Stable chest. Electronically Signed   By: Marnee SpringJonathon  Watts M.D.   On: 02/01/2019 06:25    Procedures Procedures (including critical care time)  Medications Ordered in ED Medications  naproxen (NAPROSYN) tablet 500 mg (500 mg Oral Given 02/01/19 0612)  potassium chloride SA (K-DUR,KLOR-CON) CR tablet 40 mEq (40 mEq Oral Given 02/01/19 [id number])     Initial Impression / Assessment and Plan / ED Course  I have reviewed the triage vital signs and the nursing notes.  Pertinent labs & imaging results that were available during my care of the patient were reviewed by me and considered in my medical decision making (see chart for details).  63 year old female presents with intermittent SOB/CP. She states it was worse tonight. On review  of EMR she has chronic intermittent SOB/CP and has been seen in the ED 2 times for similar complaints in the past. It was thought to be atypical and likely MSK vs anxiety at that time although she was encouraged to f/u with her PCP. She has done that and has a stress test scheduled but hasn't done it yet due to COVID. Her BP is mildly elevated here but otherwise vitals are normal. She is well appearing. Chest pain work up is reassuring. Doubt ACS, PE, pericarditis, esophageal rupture, tension pneumothorax, aortic dissection, cardiac tamponade. EKG is NSR and shows  no significant change since last. CXR is negative. Initial troponin is 0. Labs are remarkable for mild hypokalemia (3.3) and mild hyperglycemia (143). Patient is a former smoker (quit in 2005). HEART score is 3. She did have an episode where her back was hurting and informed nursing. She was given Naproxen and this improved. Will obtain 2nd troponin and anticipate d/c with PCP f/u. Care signed out to [facility]   Final Clinical Impressions(s) / ED Diagnoses   Final diagnoses:  SOB (shortness of breath)  Atypical chest pain    ED Discharge Orders    None       Bethel Born, PA-C 02/03/19 0900    Ward, Layla Maw, DO 02/06/19 2309

## 2019-02-01 NOTE — ED Notes (Addendum)
Pt discharged from ED; instructions provided; Pt encouraged to return to ED if symptoms worsen and to f/u with PCP; Pt verbalized understanding of all instructions 

## 2019-02-02 ENCOUNTER — Emergency Department (HOSPITAL_COMMUNITY)
Admission: EM | Admit: 2019-02-02 | Discharge: 2019-02-02 | Disposition: A | Discharge status: Home / Self Care | Payer: Managed Care, Other (non HMO) | Attending: Emergency Medicine | Admitting: Emergency Medicine

## 2019-02-02 ENCOUNTER — Encounter (HOSPITAL_COMMUNITY): Payer: Self-pay | Admitting: *Deleted

## 2019-02-02 ENCOUNTER — Other Ambulatory Visit: Payer: Self-pay

## 2019-02-02 DIAGNOSIS — Z87891 Personal history of nicotine dependence: Secondary | ICD-10-CM | POA: Diagnosis not present

## 2019-02-02 DIAGNOSIS — Z7982 Long term (current) use of aspirin: Secondary | ICD-10-CM | POA: Diagnosis not present

## 2019-02-02 DIAGNOSIS — R002 Palpitations: Secondary | ICD-10-CM | POA: Insufficient documentation

## 2019-02-02 DIAGNOSIS — R000 Tachycardia, unspecified: Secondary | ICD-10-CM | POA: Diagnosis present

## 2019-02-02 MED ORDER — NAPROXEN 250 MG PO TABS
500.0000 mg | ORAL_TABLET | Freq: Once | ORAL | Status: DC
  Filled 2019-02-02: qty 2

## 2019-02-02 MED ORDER — LORAZEPAM 1 MG PO TABS: 0.5000 mg | ORAL_TABLET | Freq: Three times a day (TID) | ORAL | 0 refills | Status: DC | PRN

## 2019-02-02 MED ORDER — LORAZEPAM 1 MG PO TABS
0.5000 mg | ORAL_TABLET | Freq: Once | ORAL | Status: AC
Start: 2019-02-02 — End: 2019-02-02
  Administered 2019-02-02: 05:00:00 0.5 mg via ORAL
  Filled 2019-02-02: qty 1

## 2019-02-02 NOTE — Discharge Instructions (Signed)
Take the prescribed medication as directed.  You only have to take this when you feel like you need it. Follow-up with your primary care doctor. Return to the ED for new or worsening symptoms.

## 2019-02-02 NOTE — ED Triage Notes (Signed)
The pt arrived by gems from home.  C/o her heart racing sob and anxiety  She was seen here last pm here in the ed  Was given some potassium pills  Aspirin 81 mg given to the pt in the  Ambulance  The pt walked to the abulance without losing her breath and stoppng

## 2019-02-02 NOTE — ED Provider Notes (Signed)
[facility] EMERGENCY DEPARTMENT Provider Note   CSN: [id number] Arrival date & time: 02/02/19  0347    History   Chief Complaint Chief Complaint  Patient presents with   Tachycardia    HPI Robin Clark is a 63 y.o. female.     The history is provided by the patient and medical records.     63 y.o. F here with SOB and palpitations when she woke from sleep early this morning.  She was seen here <24 hours ago for same.  Patient states she has been under a great deal of stress recently as her ex-husband recently passed.  States he had a long battle with cancer and his funeral was on Sunday, 4 days ago.  States she was at peace with this because she had time to adjust to this but however one of her coworkers died suddenly recently due to COVID complications.  States she had to be out of work for 24 hours so they could clean the office and return to work but states she has been somewhat paranoid about this.  She was seen in the ED yesterday with negative work-up, however was told her potassium was elevated and her glucose was somewhat high so now she is very concerned that her health is deteriorating.  She has not had any chest pain, cough, fever, or other upper respiratory symptoms.  States when she left here yesterday she went got some bananas to try to help with her potassium.  She states she woke up quite a few times in the middle of the night and begins worrying again.  States she is just not sure what to do or what is going to happen.  She has no known history of anxiety and is never taken any medications for such.  States she would feel better if she can follow-up with her primary care doctor, however due to pandemic her appointments have been canceled.  She is feeling better by time of my evaluation, VSS.  History reviewed. No pertinent past medical history.  There are no active problems to display for this patient.   Past Surgical History:  Procedure Laterality Date    ABDOMINAL HYSTERECTOMY       OB History   No obstetric history on file.      Home Medications    Prior to Admission medications   Medication Sig Start Date End Date Taking? Authorizing Provider  aspirin 325 MG tablet Take 325 mg by mouth every 6 (six) hours as needed for mild pain or fever.    [provider]  naproxen (NAPROSYN) 500 MG tablet Take 1 tablet (500 mg total) by mouth 2 (two) times daily with a meal. As needed for pain Patient not taking: Reported on 02/01/2019 12/07/15   Ward, Layla Maw, DO    Family History No family history on file.  Social History Social History   Tobacco Use   Smoking status: Former Smoker   Smokeless tobacco: Never Used  Substance Use Topics   Alcohol use: No   Drug use: No     Allergies   Hydrocodone   Review of Systems Review of Systems  Respiratory: Positive for shortness of breath.   Cardiovascular: Positive for palpitations.  All other systems reviewed and are negative.    Physical Exam Updated Vital Signs BP 119/77 (BP Location: Right Arm)    Pulse 88    Temp 98.1 F (36.7 C) (Oral)    Resp 11    Ht  5\' 4"  (1.626 m)    Wt 76.7 kg    LMP 01/13/2018    SpO2 98%    BMI 29.01 kg/m   Physical Exam Vitals signs and nursing note reviewed.  Constitutional:      Appearance: She is well-developed.  HENT:     Head: Normocephalic and atraumatic.  Eyes:     Conjunctiva/sclera: Conjunctivae normal.     Pupils: Pupils are equal, round, and reactive to light.  Neck:     Musculoskeletal: Normal range of motion.  Cardiovascular:     Rate and Rhythm: Normal rate and regular rhythm.     Heart sounds: Normal heart sounds.     Comments: NSR, HR 88, no rubs or murmurs Pulmonary:     Effort: Pulmonary effort is normal.     Breath sounds: Normal breath sounds.  Abdominal:     General: Bowel sounds are normal.     Palpations: Abdomen is soft.  Musculoskeletal: Normal range of motion.  Skin:    General: Skin is warm  and dry.  Neurological:     Mental Status: She is alert and oriented to person, place, and time.  Psychiatric:        Mood and Affect: Mood is anxious.     Comments: Somewhat anxious during exam, intermittently tearful when speaking about her ex-husband and her current worries      ED Treatments / Results  Labs (all labs ordered are listed, but only abnormal results are displayed) Labs Reviewed - No data to display  EKG None  Radiology Dg Chest [facility] 1 View  Result Date: 02/01/2019 CLINICAL DATA:  Shortness of breath EXAM: PORTABLE CHEST 1 VIEW COMPARISON:  02/03/2018 FINDINGS: Normal heart size and mediastinal contours. No acute infiltrate or edema. No effusion or pneumothorax. No acute osseous findings. IMPRESSION: Stable chest. Electronically Signed   By: Marnee Spring M.D.   On: 02/01/2019 06:25    Procedures Procedures (including critical care time)  Medications Ordered in ED Medications  naproxen (NAPROSYN) tablet 500 mg (500 mg Oral Not Given 02/02/19 0505)  LORazepam (ATIVAN) tablet 0.5 mg (0.5 mg Oral Given 02/02/19 0501)     Initial Impression / Assessment and Plan / ED Course  I have reviewed the triage vital signs and the nursing notes.  Pertinent labs & imaging results that were available during my care of the patient were reviewed by me and considered in my medical decision making (see chart for details).  63-year-old female here with shortness of breath and palpitations.  She was seen here yesterday for same with negative work-up including CXR, EKG, labs, trop x2.  Was told she had low potassium and elevated glucose and since then has been concerned about her own health deteriorating.  She woke up in the middle of the night extremely worried about this and began having palpitations and shortness of breath.  Improved by time of EMS arrival.  She is asymptomatic by the time of my evaluation but does become tearful when speaking about her ex-husband recent death in her  current worries about the pandemic.  We have gone over her labs from yesterday, her potassium was minimally low at 3.3 and her glucose was 146.  I Discussed with her that neither of these should necessitate serious worry, this was not a fasting sample and she does admit to eating ice cream before she came in yesterday which could account for her mildly elevated glucose.  She is not having any current chest pain or  shortness of breath.  I feel she is likely suffering from anxiety which I have discussed openly with her and she agrees.  She would like to try something for her symptoms to see if this will help her relax so she can sleep as she has been waking up several times in the middle of the night recently.  Will obtain repeat EKG give dose of ativan and reassess.  Do not feel she needs repeat labs/imaging studies at this time.  EKG unchanged from prior.  Patient resting comfortably after ativan, states she feels relaxed.  She had initially asked for some naprosyn for her back pain after lying on the stretcher but now states it feels fine and did not want it.  She is requesting to go home.  Feel this is reasonable.  Given script for small supply PRN ativan.  Can follow-up with PCP.  Return here for any new/acute changes.  Final Clinical Impressions(s) / ED Diagnoses   Final diagnoses:  Palpitations    ED Discharge Orders         Ordered    LORazepam (ATIVAN) 1 MG tablet  3 times daily PRN     02/02/19 0553           Garlon HatchetSanders, Shabree Tebbetts M, PA-C 02/02/19 [id number]    Marily MemosMesner, Jason, MD 02/02/19 [phone]

## 2019-02-16 DIAGNOSIS — E785 Hyperlipidemia, unspecified: Secondary | ICD-10-CM

## 2019-02-16 DIAGNOSIS — K5909 Other constipation: Secondary | ICD-10-CM | POA: Insufficient documentation

## 2019-02-16 DIAGNOSIS — M545 Low back pain, unspecified: Secondary | ICD-10-CM

## 2019-02-16 DIAGNOSIS — D649 Anemia, unspecified: Secondary | ICD-10-CM | POA: Insufficient documentation

## 2019-02-16 HISTORY — DX: Other constipation: K59.09

## 2019-02-16 HISTORY — DX: Low back pain: M54.5

## 2019-02-16 HISTORY — DX: Low back pain, unspecified: M54.50

## 2019-02-16 HISTORY — DX: Hyperlipidemia, unspecified: E78.5

## 2019-02-16 HISTORY — DX: Anemia, unspecified: D64.9

## 2019-02-16 NOTE — Progress Notes (Signed)
Cardiology Consultation:   Patient ID: Robin Clark MRN: [id number]; DOB: 1956/03/15  Admit date: (Not on file) Date of Consult: 02/17/2019  Primary Care Provider: Shirlean Mylar, MD Primary Cardiologist: Eden Emms Primary Electrophysiologist:  None     Patient Profile:   Robin Clark is a 63 y.o. female with a hx of HLD who is being seen today for the evaluation of chest pain and dyspnea  at the request of Dr Kateri Plummer.  History of Present Illness:   Robin Clark 63 y.o. Reviewed primary note from 07/11/18 Family history of CAD. Quit smoking in 2005  History of herniated discs and back pain.Seen in ER 12/07/15  02/03/18  02/01/19 and 02/02/19 with complaints of chest pain dyspnea and tachycardia. Recent co worker passed away from COVID and her ex husband died of cancer  Intermittent dyspnea for months  SSCP atypical sharp sporadic can be at rest Former smoker has had multiple ER visits for same All times telemetry with no arrhythmia, CXR normal no acute ECG changes and CXR normal Significant anxiety  Started crying within 2 minutes of our interview   Past Medical History:  Diagnosis Date  . Anemia 02/16/2019  . Chronic constipation 02/16/2019  . Hyperlipidemia 02/16/2019  . Low back pain 02/16/2019  . SOB (shortness of breath)   . Vaginitis     Past Surgical History:  Procedure Laterality Date  . ABDOMINAL HYSTERECTOMY       Home Medications:  Prior to Admission medications   Medication Sig Start Date End Date Taking? Authorizing Provider  aspirin 325 MG tablet Take 325 mg by mouth every 6 (six) hours as needed for mild pain or fever.    [provider]  cyclobenzaprine (FLEXERIL) 10 MG tablet Take 10 mg by mouth 3 (three) times daily as needed for muscle spasms.    [provider]  LORazepam (ATIVAN) 1 MG tablet Take 0.5 tablets (0.5 mg total) by mouth 3 (three) times daily as needed for anxiety. 02/02/19   Garlon Hatchet, PA-C  naproxen (NAPROSYN) 500 MG tablet Take 1 tablet (500 mg  total) by mouth 2 (two) times daily with a meal. As needed for pain Patient not taking: Reported on 02/01/2019 12/07/15   Ward, Layla Maw, DO    Inpatient Medications: Scheduled Meds:  Continuous Infusions:  PRN Meds:   Allergies:    Allergies  Allergen Reactions  . Hydrocodone Nausea And Vomiting and Other (See Comments)    "hallucinations"     Social History:   Social History   Socioeconomic History  . Marital status: Divorced    Spouse name: Not on file  . Number of children: Not on file  . Years of education: Not on file  . Highest education level: Not on file  Occupational History  . Not on file  Social Needs  . Financial resource strain: Not on file  . Food insecurity:    Worry: Not on file    Inability: Not on file  . Transportation needs:    Medical: Not on file    Non-medical: Not on file  Tobacco Use  . Smoking status: Former Games developer  . Smokeless tobacco: Never Used  Substance and Sexual Activity  . Alcohol use: No  . Drug use: No  . Sexual activity: Not on file    Comment: single  Lifestyle  . Physical activity:    Days per week: Not on file    Minutes per session: Not on file  . Stress: Not on file  Relationships  . Social connections:    Talks on phone: Not on file    Gets together: Not on file    Attends religious service: Not on file    Active member of club or organization: Not on file    Attends meetings of clubs or organizations: Not on file    Relationship status: Not on file  . Intimate partner violence:    Fear of current or ex partner: Not on file    Emotionally abused: Not on file    Physically abused: Not on file    Forced sexual activity: Not on file  Other Topics Concern  . Not on file  Social History Narrative  . Not on file    Family History:    Family History  Problem Relation Age of Onset  . Heart disease Mother   . Crohn's disease Mother   . CAD Mother        heart surgery  . Heart disease Father   . Crohn's  disease Father   . Diabetes Father   . CAD Father        heart surgery  . Heart disease Sister        heart surgery  . Hypertension Sister   . Hypertension Brother   . Heart disease Brother        heart surgery  . Heart disease Brother        heart surgery  . Hypertension Brother   . Hepatitis Sister      ROS:  Please see the history of present illness.   All other ROS reviewed and negative.     Physical Exam/Data:   Vitals:   02/17/19 1444  BP: 110/70  Pulse: 77  SpO2: 99%  Weight: 74.3 kg  Height: 5\' 4"  (1.626 m)   @IOBRIEF @ Last 3 Weights 02/17/2019 02/02/2019 02/02/2019  Weight (lbs) 163 lb 12.8 oz 169 lb 169 lb  Weight (kg) 74.299 kg 76.658 kg 76.658 kg     Body mass index is 28.12 kg/m.  General:  Well nourished, well developed, in no acute distress  HEENT: normal Lymph: no adenopathy Neck: no JVD Endocrine:  No thryomegaly Vascular: No carotid bruits; FA pulses 2+ bilaterally without bruits  Cardiac:  normal S1, S2; RRR; no murmur   Lungs:  clear to auscultation bilaterally, no wheezing, rhonchi or rales  Abd: soft, nontender, no hepatomegaly  Ext: no edema Musculoskeletal:  No deformities, BUE and BLE strength normal and equal Skin: warm and dry  Neuro:  CNs 2-12 intact, no focal abnormalities noted Psych:  Normal affect   EKG:  The EKG was personally reviewed and demonstrates:   02/02/19 NSR normal low voltage  02/17/19 Lots of artifact SR rate 97 PVC nonspecific ST changes  Telemetry:  Telemetry was personally reviewed and demonstrates:  NSR in ER x 2  Relevant CV Studies: None  Laboratory Data:  ChemistryNo results for input(s): NA, K, CL, CO2, GLUCOSE, BUN, CREATININE, CALCIUM, GFRNONAA, GFRAA, ANIONGAP in the last 168 hours.  No results for input(s): PROT, ALBUMIN, AST, ALT, ALKPHOS, BILITOT in the last 168 hours. HematologyNo results for input(s): WBC, RBC, HGB, HCT, MCV, MCH, MCHC, RDW, PLT in the last 168 hours. Cardiac EnzymesNo results for  input(s): TROPONINI in the last 168 hours. No results for input(s): TROPIPOC in the last 168 hours.  BNPNo results for input(s): BNP, PROBNP in the last 168 hours.  DDimer No results for input(s): DDIMER in the last 168 hours.  Radiology/Studies:  No results found.  Assessment and Plan:   1. Chest Pain: atypical multiple negative ER visits  Clearly her symptoms are precipitated by anxiety no need for stress testing at this time f/u in 6 months after getting better Rx for her psychiatric issues  2. Dyspnea: functional normal exam and chest xray as well as ECG no need for echo 3. Palpitations: benign related to anxiety no need for monitor 4. Anxiety:  Related to ex husbands death and co worker passing from COVID19 PRN ativan f/u primary suggest counseling She was prescribed citalopram by primary but was too scared to start it Told her she should and it would reduce the need for ativan   For questions or updates, please contact [facility] Please consult [email or web address] for contact info under     Signed, Charlton Haws, MD  02/17/2019 3:06 PM

## 2019-02-17 ENCOUNTER — Encounter: Payer: Self-pay | Admitting: Cardiovascular Disease

## 2019-02-17 ENCOUNTER — Other Ambulatory Visit: Payer: Self-pay

## 2019-02-17 ENCOUNTER — Ambulatory Visit (INDEPENDENT_AMBULATORY_CARE_PROVIDER_SITE_OTHER): Payer: Managed Care, Other (non HMO) | Admitting: Cardiovascular Disease

## 2019-02-17 VITALS — BP 110/70 | HR 77 | Ht 64.0 in | Wt 163.8 lb

## 2019-02-17 DIAGNOSIS — R0789 Other chest pain: Secondary | ICD-10-CM | POA: Diagnosis not present

## 2019-02-17 DIAGNOSIS — R0600 Dyspnea, unspecified: Secondary | ICD-10-CM

## 2019-02-17 DIAGNOSIS — R002 Palpitations: Secondary | ICD-10-CM

## 2019-02-17 NOTE — Patient Instructions (Signed)

## 2019-05-18 ENCOUNTER — Other Ambulatory Visit: Payer: Self-pay

## 2019-05-18 DIAGNOSIS — Z20822 Contact with and (suspected) exposure to covid-19: Secondary | ICD-10-CM

## 2019-05-21 LAB — NOVEL CORONAVIRUS, NAA: SARS-CoV-2, NAA: NOT DETECTED

## 2019-06-23 ENCOUNTER — Other Ambulatory Visit: Payer: Self-pay | Admitting: Family Medicine

## 2019-06-23 DIAGNOSIS — Z1231 Encounter for screening mammogram for malignant neoplasm of breast: Secondary | ICD-10-CM

## 2019-08-04 ENCOUNTER — Ambulatory Visit: Payer: Managed Care, Other (non HMO)

## 2019-11-01 ENCOUNTER — Ambulatory Visit: Payer: 59 | Attending: Internal Medicine

## 2019-11-01 DIAGNOSIS — Z20822 Contact with and (suspected) exposure to covid-19: Secondary | ICD-10-CM | POA: Insufficient documentation

## 2019-11-02 LAB — NOVEL CORONAVIRUS, NAA: SARS-CoV-2, NAA: NOT DETECTED

## 2019-12-27 NOTE — Progress Notes (Signed)
Virtual Visit via Telephone Note   This visit type was conducted due to national recommendations for restrictions regarding the COVID-19 Pandemic (e.g. social distancing) in an effort to limit this patient's exposure and mitigate transmission in our community.  Due to her co-morbid illnesses, this patient is at least at moderate risk for complications without adequate follow up.  This format is felt to be most appropriate for this patient at this time.  The patient did not have access to video technology/had technical difficulties with video requiring transitioning to audio format only (telephone).  All issues noted in this document were discussed and addressed.  No physical exam could be performed with this format.  Please refer to the patient's chart for her  consent to telehealth for [facility].   Date:  12/27/2019   ID:  Robin Clark, DOB Jan 13, 1956, MRN [id number]  Patient Location: Grocery store Provider Location: Home  PCP:  Shirlean Mylar, MD  Cardiologist:  No primary care provider on file.  Electrophysiologist:  None   Evaluation Performed:  Follow-Up Visit  Chief Complaint: 21-month follow-up, seen for Dr. Eden Emms  History of Present Illness:    Robin Clark is a 64 y.o. female with a history of HLD, remote tobacco use, family history of CAD.  She was most recently seen by Dr. Eden Emms 02/17/2019 at which time the patient was noted to have had intermittent dyspnea with substernal, atypical chest pain which was noted to be sporadic, occurring with and without exertion.  She had been seen in the ER multiple times for similar symptoms which telemetry showed no arrhythmia, CXR imaging was normal and no acute changes on EKG.  During her last OV, the patient had significant anxiety and began crying within several minutes of the encounter.  No further cardiac testing was recommended at that time with plans for close follow-up after Rx for anxiety.  Today Ms. Cocking was seen via telemedicine  visit. We attempted a MyChart virtual visit however this failed therefore we transitioned to a telephone visit.  During our brief encounter, she reports that she was in the middle of the grocery store and therefore had poor reception and would prefer to proceed with an in-person visit.  She did have some complaints of lower extremity edema for which she had seen her PCP about. Sounds like she was given a short course of furosemide with improvement. She does state that the swelling will decrease with leg elevation. Unfortunately, this is difficult to assess given our current locations. She has no BP reading for me and there is no recent lab work. Last lab work in our system is from 01/2019. She specifically denies chest pain, palpitations, orthopnea, dizziness or syncope. We will schedule her to come in for further evaluation.   The patient does not have symptoms concerning for COVID-19 infection (fever, chills, cough, or new shortness of breath).    Past Medical History:  Diagnosis Date  . Anemia 02/16/2019  . Chronic constipation 02/16/2019  . Hyperlipidemia 02/16/2019  . Low back pain 02/16/2019  . SOB (shortness of breath)   . Vaginitis    Past Surgical History:  Procedure Laterality Date  . ABDOMINAL HYSTERECTOMY       No outpatient medications have been marked as taking for the 12/29/19 encounter (Appointment) with Filbert Schilder, NP.     Allergies:   Hydrocodone   Social History   Tobacco Use  . Smoking status: Former Games developer  . Smokeless tobacco: Never Used  Substance Use  Topics  . Alcohol use: No  . Drug use: No     Family Hx: The patient's family history includes CAD in her father and mother; Crohn's disease in her father and mother; Diabetes in her father; Heart disease in her brother, brother, father, mother, and sister; Hepatitis in her sister; Hypertension in her brother, brother, and sister.  ROS:   Please see the history of present illness.     All other systems  reviewed and are negative.  Prior CV studies:   The following studies were reviewed today:  None  Labs/Other Tests and Data Reviewed:    EKG:    Recent Labs: 02/01/2019: BUN 11; Creatinine, Ser 0.69; Hemoglobin 13.2; Platelets 269; Potassium 3.3; Sodium 139   Recent Lipid Panel No results found for: CHOL, TRIG, HDL, CHOLHDL, LDLCALC, LDLDIRECT  Wt Readings from Last 3 Encounters:  02/17/19 163 lb 12.8 oz (74.3 kg)  02/02/19 169 lb (76.7 kg)  02/01/19 169 lb (76.7 kg)     Objective:    Vital Signs:  LMP 01/13/2018    VITAL SIGNS:  reviewed GEN:  no acute distress NEURO:  alert and oriented x 3, no obvious focal deficit PSYCH:  normal affect  ASSESSMENT & PLAN:    1.  Chest pain: -No further ED visits>>denies recurrence -Continue current regimen for now   2.  Dyspnea: -Reports she has been having increased dyspnea for which it sounds like she has been seen by her PCP with a short course of furosemide.  Our visit was complicated by poor reception given that the patient was in the grocery store during this time.  She prefers to be seen in the office which I feel is acceptable for further evaluation.  She may need repeat echocardiogram, lab work as well as more consistent Lasix. -States LE edema is mild which improves with leg elevation. -Does not sound short of breath during our encounter today  3.  Palpitations: -Felt to be previously related to anxiety with no need for outpatient monitoring -Denies recurrence  4.  Anxiety: -History of anxiety for which she has seen her PCP and was prescribed citalopram. She was counseled that this would be beneficial.   COVID-19 Education: The signs and symptoms of COVID-19 were discussed with the patient and how to seek care for testing (follow up with PCP or arrange E-visit).  The importance of social distancing was discussed today.  Time:   Today, I have spent 10 minutes with the patient with telehealth technology discussing the  above problems.     Medication Adjustments/Labs and Tests Ordered: Current medicines are reviewed at length with the patient today.  Concerns regarding medicines are outlined above.   Tests Ordered: No orders of the defined types were placed in this encounter.   Medication Changes: No orders of the defined types were placed in this encounter.   Follow Up:  In Person with myself next week   Signed, Kathyrn Drown, NP  12/27/2019 1:00 PM    [place]

## 2019-12-29 ENCOUNTER — Encounter: Payer: Self-pay | Admitting: Cardiology

## 2019-12-29 ENCOUNTER — Telehealth (INDEPENDENT_AMBULATORY_CARE_PROVIDER_SITE_OTHER): Payer: 59 | Admitting: Cardiology

## 2019-12-29 VITALS — Ht 64.0 in | Wt 175.0 lb

## 2019-12-29 DIAGNOSIS — F419 Anxiety disorder, unspecified: Secondary | ICD-10-CM | POA: Diagnosis not present

## 2019-12-29 DIAGNOSIS — R0600 Dyspnea, unspecified: Secondary | ICD-10-CM

## 2019-12-29 NOTE — Patient Instructions (Signed)
Medication Instructions:   Your physician recommends that you continue on your current medications as directed. Please refer to the Current Medication list given to you today.  *If you need a refill on your cardiac medications before your next appointment, please call your pharmacy*  Lab Work:  None ordered today  If you have labs (blood work) drawn today and your tests are completely normal, you will receive your results only by: Marland Kitchen MyChart Message (if you have MyChart) OR . A paper copy in the mail If you have any lab test that is abnormal or we need to change your treatment, we will call you to review the results.  Testing/Procedures:  None ordered today  Follow-Up: At [facility], you and your health needs are our priority.  As part of our continuing mission to provide you with exceptional heart care, we have created designated Provider Care Teams.  These Care Teams include your primary Cardiologist (physician) and Advanced Practice Providers (APPs -  Physician Assistants and Nurse Practitioners) who all work together to provide you with the care you need, when you need it.  We recommend signing up for the patient portal called "MyChart".  Sign up information is provided on this After Visit Summary.  MyChart is used to connect with patients for Virtual Visits (Telemedicine).  Patients are able to view lab/test results, encounter notes, upcoming appointments, etc.  Non-urgent messages can be sent to your provider as well.   To learn more about what you can do with MyChart, go to [email or web address].    Your next appointment:    On 01/03/20 with Georgie Chard, NP

## 2019-12-30 NOTE — Progress Notes (Deleted)
Cardiology Office Note   Date:  12/30/2019   ID:  Robin Clark, DOB 1956/09/30, MRN [id number]  PCP:  Robin Mylar, MD  Cardiologist: Dr. Eden Emms  No chief complaint on file.     History of Present Illness: Robin Clark is a 64 y.o. female who presents for in person follow-up for multiple issues, seen for Dr. Eden Emms.   [name] has a history of HLD, remote tobacco use, family history of CAD.  She was most recently seen by Dr. Eden Emms 02/17/2019 at which time the patient was noted to have had intermittent dyspnea with substernal, atypical chest pain which was noted to be sporadic, occurring with and without exertion.  She had been seen in the ER multiple times for similar symptoms which telemetry showed no arrhythmia, CXR imaging was normal and no acute changes on EKG.  During her last OV, the patient had significant anxiety and began crying within several minutes of the encounter.  No further cardiac testing was recommended at that time with plans for close follow-up after Rx for anxiety.  On 12/29/2019, she  was seen via telemedicine visit by myself.  We attempted a MyChart virtual visit however this failed therefore we transitioned to a telephone visit.  During our brief encounter, she reported that she was in the middle of the grocery store and therefore had poor reception and would prefer to proceed with an in-person visit.  She did have some complaints of lower extremity edema for which she had seen her PCP and was given what sounds like a short course of furosemide with improvement. She does state that the swelling will decrease with leg elevation. Unfortunately, this was difficult to assess given our locations. She had no BP reading for me and there is no recent lab work to base treatment on. Last lab work in our system is from 01/2019. She specifically denied chest pain, palpitations, orthopnea, dizziness or syncope.   Today,   1.  Chest pain: -No further ED visits>>denies  recurrence -Continue current regimen for now   2.  Dyspnea: -Reports she has been having increased dyspnea for which it sounds like she has been seen by her PCP with a short course of furosemide.  Our visit was complicated by poor reception given that the patient was in the grocery store during this time.  She prefers to be seen in the office which I feel is acceptable for further evaluation.  She may need repeat echocardiogram, lab work as well as more consistent Lasix. -States LE edema is mild which improves with leg elevation. -Does not sound short of breath during our encounter today  3.  Palpitations: -Felt to be previously related to anxiety with no need for outpatient monitoring -Denies recurrence  4.  Anxiety: -History of anxiety for which she has seen her PCP and was prescribed citalopram. She was counseled that this would be beneficial.    Past Medical History:  Diagnosis Date  . Anemia 02/16/2019  . Chronic constipation 02/16/2019  . Hyperlipidemia 02/16/2019  . Low back pain 02/16/2019  . SOB (shortness of breath)   . Vaginitis     Past Surgical History:  Procedure Laterality Date  . ABDOMINAL HYSTERECTOMY       Current Outpatient Medications  Medication Sig Dispense Refill  . aspirin 325 MG tablet Take 325 mg by mouth every 6 (six) hours as needed for mild pain or fever.    Marland Kitchen LORazepam (ATIVAN) 1 MG tablet Take 0.5 tablets (0.5  mg total) by mouth 3 (three) times daily as needed for anxiety. 15 tablet 0  . naproxen (NAPROSYN) 500 MG tablet Take 1 tablet (500 mg total) by mouth 2 (two) times daily with a meal. As needed for pain 30 tablet 0   No current facility-administered medications for this visit.    Allergies:   Hydrocodone    Social History:  The patient  reports that she has quit smoking. She has never used smokeless tobacco. She reports that she does not drink alcohol or use drugs.   Family History:  The patient's ***family history includes CAD in  her father and mother; Crohn's disease in her father and mother; Diabetes in her father; Heart disease in her brother, brother, father, mother, and sister; Hepatitis in her sister; Hypertension in her brother, brother, and sister.    ROS:  Please see the history of present illness.   Otherwise, review of systems are positive for {NONE DEFAULTED:18576::"none"}.   All other systems are reviewed and negative.    PHYSICAL EXAM: VS:  LMP 01/13/2018  , BMI There is no height or weight on file to calculate BMI. GEN: Well nourished, well developed, in no acute distress HEENT: normal Neck: no JVD, carotid bruits, or masses Cardiac: ***RRR; no murmurs, rubs, or gallops,no edema  Respiratory:  clear to auscultation bilaterally, normal work of breathing GI: soft, nontender, nondistended, + BS MS: no deformity or atrophy Skin: warm and dry, no rash Neuro:  Strength and sensation are intact Psych: euthymic mood, full affect   EKG:  EKG {ACTION; IS/IS ERX:[id number]} ordered today. The ekg ordered today demonstrates ***   Recent Labs: 02/01/2019: BUN 11; Creatinine, Ser 0.69; Hemoglobin 13.2; Platelets 269; Potassium 3.3; Sodium 139    Lipid Panel No results found for: CHOL, TRIG, HDL, CHOLHDL, VLDL, LDLCALC, LDLDIRECT    Wt Readings from Last 3 Encounters:  12/29/19 175 lb (79.4 kg)  02/17/19 163 lb 12.8 oz (74.3 kg)  02/02/19 169 lb (76.7 kg)      Other studies Reviewed: Additional studies/ records that were reviewed today include: ***. Review of the above records demonstrates: ***   ASSESSMENT AND PLAN:  1.  ***   Current medicines are reviewed at length with the patient today.  The patient {ACTIONS; HAS/DOES NOT HAVE:19233} concerns regarding medicines.  The following changes have been made:  {PLAN; NO CHANGE:13088:s}  Labs/ tests ordered today include: *** No orders of the defined types were placed in this encounter.    Disposition:   FU with *** in {gen number  1-95:093267} {Days to years:10300}  Signed, Kathyrn Drown, NP  12/30/2019 11:46 AM    [facility] [address], [place], [place]  [address] Phone: [phone]; Fax: [phone]

## 2020-01-03 ENCOUNTER — Ambulatory Visit: Payer: 59 | Admitting: Cardiology

## 2020-01-18 NOTE — Progress Notes (Signed)
Virtual Visit via Video Note   This visit type was conducted due to national recommendations for restrictions regarding the COVID-19 Pandemic (e.g. social distancing) in an effort to limit this patient's exposure and mitigate transmission in our community.  Due to her co-morbid illnesses, this patient is at least at moderate risk for complications without adequate follow up.  This format is felt to be most appropriate for this patient at this time.  All issues noted in this document were discussed and addressed.  A limited physical exam was performed with this format.  Please refer to the patient's chart for her consent to telehealth for [facility].   Patient location: Home Physician location: Office  Patient ID: Robin Clark MRN: [id number]; DOB: 1956-08-04   Primary Care Provider: Shirlean Mylar, MD Primary Cardiologist: Eden Emms   History of Present Illness:   Robin Clark 64 y.o. first seen on 02/17/19  Reviewed primary note from 07/11/18 Family history of CAD. Quit smoking in 2005  History of herniated discs and back pain.Seen in ER 12/07/15  02/03/18  02/01/19 and 02/02/19 with complaints of chest pain dyspnea and tachycardia. C worker passed away from COVID and her ex husband died of cancer  Intermittent dyspnea for months  SSCP atypical sharp sporadic can be at rest Former smoker has had multiple ER visits for same All times telemetry with no arrhythmia, CXR normal no acute ECG changes and CXR normal Significant anxiety  Started crying within 2 minutes of our interview last visit   Seen by PA  Telemed 12/29/19 and complained of edema and primary had given some lasix   Has been having some sharp pains in both arms. No cervical neck issues Seeing primary in person latter today Still does not want to take any meds for anxiety / depression   Past Medical History:  Diagnosis Date  . Anemia 02/16/2019  . Chronic constipation 02/16/2019  . Hyperlipidemia 02/16/2019  . Low back pain 02/16/2019  . SOB  (shortness of breath)   . Vaginitis     Past Surgical History:  Procedure Laterality Date  . ABDOMINAL HYSTERECTOMY       Home Medications:  Prior to Admission medications   Medication Sig Start Date End Date Taking? Authorizing Provider  aspirin 325 MG tablet Take 325 mg by mouth every 6 (six) hours as needed for mild pain or fever.    [provider]  cyclobenzaprine (FLEXERIL) 10 MG tablet Take 10 mg by mouth 3 (three) times daily as needed for muscle spasms.    [provider]  LORazepam (ATIVAN) 1 MG tablet Take 0.5 tablets (0.5 mg total) by mouth 3 (three) times daily as needed for anxiety. 02/02/19   Garlon Hatchet, PA-C  naproxen (NAPROSYN) 500 MG tablet Take 1 tablet (500 mg total) by mouth 2 (two) times daily with a meal. As needed for pain Patient not taking: Reported on 02/01/2019 12/07/15   Ward, Layla Maw, DO    Inpatient Medications: Scheduled Meds:  Continuous Infusions:  PRN Meds:   Allergies:    Allergies  Allergen Reactions  . Hydrocodone Nausea And Vomiting and Other (See Comments)    "hallucinations"     Social History:   Social History   Socioeconomic History  . Marital status: Divorced    Spouse name: Not on file  . Number of children: Not on file  . Years of education: Not on file  . Highest education level: Not on file  Occupational History  . Not on  file  Tobacco Use  . Smoking status: Former Research scientist (life sciences)  . Smokeless tobacco: Never Used  Substance and Sexual Activity  . Alcohol use: No  . Drug use: No  . Sexual activity: Not on file    Comment: single  Other Topics Concern  . Not on file  Social History Narrative  . Not on file   Social Determinants of Health   Financial Resource Strain:   . Difficulty of Paying Living Expenses:   Food Insecurity:   . Worried About Charity fundraiser in the Last Year:   . Arboriculturist in the Last Year:   Transportation Needs:   . Film/video editor (Medical):   Marland Kitchen Lack of  Transportation (Non-Medical):   Physical Activity:   . Days of Exercise per Week:   . Minutes of Exercise per Session:   Stress:   . Feeling of Stress :   Social Connections:   . Frequency of Communication with Friends and Family:   . Frequency of Social Gatherings with Friends and Family:   . Attends Religious Services:   . Active Member of Clubs or Organizations:   . Attends Archivist Meetings:   Marland Kitchen Marital Status:   Intimate Partner Violence:   . Fear of Current or Ex-Partner:   . Emotionally Abused:   Marland Kitchen Physically Abused:   . Sexually Abused:     Family History:    Family History  Problem Relation Age of Onset  . Heart disease Mother   . Crohn's disease Mother   . CAD Mother        heart surgery  . Heart disease Father   . Crohn's disease Father   . Diabetes Father   . CAD Father        heart surgery  . Heart disease Sister        heart surgery  . Hypertension Sister   . Hypertension Brother   . Heart disease Brother        heart surgery  . Heart disease Brother        heart surgery  . Hypertension Brother   . Hepatitis Sister      ROS:  Please see the history of present illness.   All other ROS reviewed and negative.     Physical Exam/Data:   There were no vitals filed for this visit. @IOBRIEF @ Last 3 Weights 12/29/2019 02/17/2019 02/02/2019  Weight (lbs) 175 lb 163 lb 12.8 oz 169 lb  Weight (kg) 79.379 kg 74.299 kg 76.658 kg     There is no height or weight on file to calculate BMI.   Telephone no exam   EKG:   02/17/19 Lots of artifact SR rate 97 PVC nonspecific ST changes   Telemetry:  telemetry was personally reviewed and demonstrates:  NSR in ER x 2   Relevant CV Studies: None  Laboratory Data:  ChemistryNo results for input(s): NA, K, CL, CO2, GLUCOSE, BUN, CREATININE, CALCIUM, GFRNONAA, GFRAA, ANIONGAP in the last 168 hours.  No results for input(s): PROT, ALBUMIN, AST, ALT, ALKPHOS, BILITOT in the last 168 hours. HematologyNo  results for input(s): WBC, RBC, HGB, HCT, MCV, MCH, MCHC, RDW, PLT in the last 168 hours. Cardiac EnzymesNo results for input(s): TROPONINI in the last 168 hours. No results for input(s): TROPIPOC in the last 168 hours.  BNPNo results for input(s): BNP, PROBNP in the last 168 hours.  DDimer No results for input(s): DDIMER in the last 168 hours.  Radiology/Studies:  No results found.  Assessment and Plan:   1. Chest Pain: atypical multiple negative ER visits  Clearly her symptoms are precipitated by anxiety no need for stress testing  2. Dyspnea: functional normal exam and chest xray as well as ECG given recurrence of this and edema will order echo  3. Palpitations: benign related to anxiety no need for monitor 4. Anxiety:  Related to ex husbands death and co worker passing from COVID19 PRN ativan f/u primary suggest counseling She was prescribed citalopram by primary but was too scared to start it Told her she should and it would reduce the need for ativan  Echo : for palpitations/dyspne/edema F/U in person 6 months   For questions or updates, please contact [facility] Please consult [email or web address] for contact info under     Signed, Charlton Haws, MD  01/18/2020 3:24 PM

## 2020-01-24 ENCOUNTER — Other Ambulatory Visit: Payer: Self-pay

## 2020-01-24 ENCOUNTER — Telehealth: Payer: Self-pay | Admitting: Cardiovascular Disease

## 2020-01-24 ENCOUNTER — Telehealth (INDEPENDENT_AMBULATORY_CARE_PROVIDER_SITE_OTHER): Payer: 59 | Admitting: Cardiovascular Disease

## 2020-01-24 VITALS — Ht 64.0 in | Wt 169.0 lb

## 2020-01-24 DIAGNOSIS — R002 Palpitations: Secondary | ICD-10-CM

## 2020-01-24 DIAGNOSIS — R0600 Dyspnea, unspecified: Secondary | ICD-10-CM | POA: Diagnosis not present

## 2020-01-24 DIAGNOSIS — R0609 Other forms of dyspnea: Secondary | ICD-10-CM

## 2020-01-24 NOTE — Patient Instructions (Signed)
Medication Instructions:  °*If you need a refill on your cardiac medications before your next appointment, please call your pharmacy* ° °Lab Work: °If you have labs (blood work) drawn today and your tests are completely normal, you will receive your results only by: °• MyChart Message (if you have MyChart) OR °• A paper copy in the mail °If you have any lab test that is abnormal or we need to change your treatment, we will call you to review the results. ° °Testing/Procedures: °Your physician has requested that you have an echocardiogram. Echocardiography is a painless test that uses sound waves to create images of your heart. It provides your doctor with information about the size and shape of your heart and how well your heart’s chambers and valves are working. This procedure takes approximately one hour. There are no restrictions for this procedure. ° °Follow-Up: °At [facility], you and your health needs are our priority.  As part of our continuing mission to provide you with exceptional heart care, we have created designated Provider Care Teams.  These Care Teams include your primary Cardiologist (physician) and Advanced Practice Providers (APPs -  Physician Assistants and Nurse Practitioners) who all work together to provide you with the care you need, when you need it. ° °We recommend signing up for the patient portal called "MyChart".  Sign up information is provided on this After Visit Summary.  MyChart is used to connect with patients for Virtual Visits (Telemedicine).  Patients are able to view lab/test results, encounter notes, upcoming appointments, etc.  Non-urgent messages can be sent to your provider as well.   °To learn more about what you can do with MyChart, go to [email or web address].   ° °Your next appointment:   °6 month(s) ° °The format for your next appointment:   °In Person ° °Provider:   °You may see Dr. Nishan or one of the following Advanced Practice Providers on your designated  Care Team:   °· Lori Gerhardt, NP °· Laura Ingold, NP °· Jill McDaniel, NP ° ° ° °

## 2020-02-06 ENCOUNTER — Other Ambulatory Visit: Payer: Self-pay | Admitting: Family Medicine

## 2020-02-06 ENCOUNTER — Ambulatory Visit
Admission: RE | Admit: 2020-02-06 | Discharge: 2020-02-06 | Disposition: A | Discharge status: Home / Self Care | Payer: 59 | Source: Ambulatory Visit | Attending: Family Medicine | Admitting: Family Medicine

## 2020-02-06 DIAGNOSIS — R0781 Pleurodynia: Secondary | ICD-10-CM

## 2020-02-08 ENCOUNTER — Ambulatory Visit: Payer: 59

## 2020-02-19 ENCOUNTER — Telehealth (HOSPITAL_COMMUNITY): Payer: Self-pay | Admitting: Cardiovascular Disease

## 2020-02-19 NOTE — Telephone Encounter (Signed)
Patient called and cancelled echocardiogram for 02/21/2020 and did not wish to reschedule at this time. Order will be removed from the WQ.

## 2020-02-21 ENCOUNTER — Other Ambulatory Visit (HOSPITAL_COMMUNITY): Payer: 59

## 2020-07-17 ENCOUNTER — Other Ambulatory Visit: Payer: Self-pay | Admitting: Family Medicine

## 2020-07-17 ENCOUNTER — Ambulatory Visit
Admission: RE | Admit: 2020-07-17 | Discharge: 2020-07-17 | Disposition: A | Discharge status: Home / Self Care | Payer: 59 | Source: Ambulatory Visit | Attending: Family Medicine | Admitting: Family Medicine

## 2020-07-17 DIAGNOSIS — R0602 Shortness of breath: Secondary | ICD-10-CM

## 2020-08-07 NOTE — Progress Notes (Deleted)
Cardiology Office Note   Date:  08/07/2020   ID:  Milta Croson, DOB 1956-03-18, MRN [id number]  PCP:  Shirlean Mylar, MD  Cardiologist:  Dr. Eden Emms, MD   No chief complaint on file.     History of Present Illness: Robin Clark is a 64 y.o. female who presents for follow up of atypical chest pain, seen for Dr. Eden Emms.   Ms. Hocutt has a hx of tobacco use (quit 2005) and family hx of CAD.  She was most recently seen by Dr. Eden Emms 01/24/2020 in telemedicine follow up at which time she had c/o intermittent dyspnea for several months with associated SSCP described as sharp and sporadic. Pain was occurring at rest, along with exertion. She had many ER visits related to this with no exact etiology of her symptoms. Workup was essentially negative with negative enzymes, CXR, EKG, and CXR. Pain felt to be possibly anxiety provoked. No stress test was recommended at that time. Echocardiogram was ordered for follow up of dyspnea however the patient called to cancel the study.   1. Chest pain: -Felt to be anxiety provoked given multiple negative cardiac workups that included negative EKGs, hsT and other imaging such as CXR.  -No indication for stress test at last OV   2. Dysnea: -Noted on last follow up>>>echo ordered however patient called and cacelled study with no reschedule   3. Palpitations: -Felt to be related to anxiety   4. Anxiety: -Counseling suggested at last follow up>>previously prescribed citalopram however she was too scared to take it    Past Medical History:  Diagnosis Date  . Anemia 02/16/2019  . Chronic constipation 02/16/2019  . Hyperlipidemia 02/16/2019  . Low back pain 02/16/2019  . SOB (shortness of breath)   . Vaginitis     Past Surgical History:  Procedure Laterality Date  . ABDOMINAL HYSTERECTOMY       Current Outpatient Medications  Medication Sig Dispense Refill  . aspirin 325 MG tablet Take 325 mg by mouth every 6 (six) hours as needed for mild pain or  fever.     No current facility-administered medications for this visit.    Allergies:   Hydrocodone    Social History:  The patient  reports that she has quit smoking. She has never used smokeless tobacco. She reports that she does not drink alcohol and does not use drugs.   Family History:  The patient's ***family history includes CAD in her father and mother; Crohn's disease in her father and mother; Diabetes in her father; Heart disease in her brother, brother, father, mother, and sister; Hepatitis in her sister; Hypertension in her brother, brother, and sister.    ROS:  Please see the history of present illness.   Otherwise, review of systems are positive for {NONE DEFAULTED:18576::"none"}.   All other systems are reviewed and negative.    PHYSICAL EXAM: VS:  LMP 01/13/2018  , BMI There is no height or weight on file to calculate BMI. GEN: Well nourished, well developed, in no acute distress HEENT: normal Neck: no JVD, carotid bruits, or masses Cardiac: ***RRR; no murmurs, rubs, or gallops,no edema  Respiratory:  clear to auscultation bilaterally, normal work of breathing GI: soft, nontender, nondistended, + BS MS: no deformity or atrophy Skin: warm and dry, no rash Neuro:  Strength and sensation are intact Psych: euthymic mood, full affect   EKG:  EKG {ACTION; IS/IS GLO:[id number]} ordered today. The ekg ordered today demonstrates ***   Recent Labs: No results  found for requested labs within last 8760 hours.    Lipid Panel No results found for: CHOL, TRIG, HDL, CHOLHDL, VLDL, LDLCALC, LDLDIRECT    Wt Readings from Last 3 Encounters:  01/24/20 169 lb (76.7 kg)  12/29/19 175 lb (79.4 kg)  02/17/19 163 lb 12.8 oz (74.3 kg)      Other studies Reviewed: Additional studies/ records that were reviewed today include: ***. Review of the above records demonstrates: ***   ASSESSMENT AND PLAN:  1.  ***   Current medicines are reviewed at length with the patient  today.  The patient {ACTIONS; HAS/DOES NOT HAVE:19233} concerns regarding medicines.  The following changes have been made:  {PLAN; NO CHANGE:13088:s}  Labs/ tests ordered today include: *** No orders of the defined types were placed in this encounter.    Disposition:   FU with *** in {gen number 4-66:599357} {Days to years:10300}  Signed, Georgie Chard, NP  08/07/2020 10:50 AM    [facility] [address] [place], [place], [place]  [address] Phone: [phone]; Fax: [phone]

## 2020-08-15 ENCOUNTER — Ambulatory Visit: Payer: 59 | Admitting: Cardiology

## 2020-08-21 ENCOUNTER — Other Ambulatory Visit: Payer: Self-pay | Admitting: Family Medicine

## 2020-08-21 DIAGNOSIS — Z1231 Encounter for screening mammogram for malignant neoplasm of breast: Secondary | ICD-10-CM

## 2020-08-22 ENCOUNTER — Ambulatory Visit: Payer: 59

## 2020-09-02 ENCOUNTER — Other Ambulatory Visit: Payer: Self-pay

## 2020-09-02 ENCOUNTER — Ambulatory Visit
Admission: RE | Admit: 2020-09-02 | Discharge: 2020-09-02 | Disposition: A | Discharge status: Home / Self Care | Payer: 59 | Source: Ambulatory Visit | Attending: Family Medicine | Admitting: Family Medicine

## 2020-09-02 DIAGNOSIS — Z1231 Encounter for screening mammogram for malignant neoplasm of breast: Secondary | ICD-10-CM

## 2020-09-07 NOTE — Progress Notes (Deleted)
Cardiology Office Note   Date:  09/07/2020   ID:  Robin Clark, DOB 08-Sep-1956, MRN [id number]  PCP:  Shirlean Mylar, MD  Cardiologist:  Georgie Chard, NP   No chief complaint on file.     History of Present Illness: Robin Clark is a 64 y.o. female who presents for follow-up, seen for Dr. Eden Emms  Ms. Sheller has a history of HLD, remote tobacco use and family history of CAD.   She was most recently seen by Dr. Eden Emms 02/17/2019 at which time the patient was noted to have had intermittent dyspnea with substernal, atypical chest pain which was noted to be sporadic, occurring with and without exertion.  She had been seen in the ER multiple times for similar symptoms which telemetry showed no arrhythmia, CXR imaging was normal and no acute changes on EKG.  During her last OV, the patient had significant anxiety and began crying within several minutes of the encounter.  No further cardiac testing was recommended at that time with plans for close follow-up after Rx for anxiety.  She was seen by myself 12/29/2019 at which time she reported that she was in the middle of the grocery store and therefore had poor reception and would prefer to proceed with an in-person visit.  She did have some complaints of lower extremity edema for which she had seen her PCP about. Sounds like she was given a short course of furosemide with improvement. She does state that the swelling will decrease with leg elevation. Unfortunately, this is difficult to assess given our current locations. She has no BP reading for me and there is no recent lab work. Last lab work in our system is from 01/2019. She specifically denies chest pain, palpitations, orthopnea, dizziness or syncope. We will schedule her to come in for further evaluation.   She was then seen 01/24/2020 by Dr. Eden Emms with persistent sharp pains in bilateral arms along with significant anxiety. Dr. Eden Emms felt there was no need for further stress testing.  Plan was  for echocardiogram given dyspnea.  She was given as needed Ativan with plans to follow-up with PCP and recommendations for counseling.  Echocardiogram was ordered however this is not yet been performed.  1.  Chest pain: -No further ED visits>>denies recurrence -Continue current regimen for now   2.  Dyspnea: -Reports she has been having increased dyspnea for which it sounds like she has been seen by her PCP with a short course of furosemide.  Our visit was complicated by poor reception given that the patient was in the grocery store during this time.  She prefers to be seen in the office which I feel is acceptable for further evaluation.  She may need repeat echocardiogram, lab work as well as more consistent Lasix. -States LE edema is mild which improves with leg elevation. -Does not sound short of breath during our encounter today  3.  Palpitations: -Felt to be previously related to anxiety with no need for outpatient monitoring -Denies recurrence  4.  Anxiety: -History of anxiety for which she has seen her PCP and was prescribed citalopram. She was counseled that this would be beneficial.   Past Medical History:  Diagnosis Date  . Anemia 02/16/2019  . Chronic constipation 02/16/2019  . Hyperlipidemia 02/16/2019  . Low back pain 02/16/2019  . SOB (shortness of breath)   . Vaginitis     Past Surgical History:  Procedure Laterality Date  . ABDOMINAL HYSTERECTOMY  Current Outpatient Medications  Medication Sig Dispense Refill  . aspirin 325 MG tablet Take 325 mg by mouth every 6 (six) hours as needed for mild pain or fever.     No current facility-administered medications for this visit.    Allergies:   Hydrocodone    Social History:  The patient  reports that she has quit smoking. She has never used smokeless tobacco. She reports that she does not drink alcohol and does not use drugs.   Family History:  The patient's ***family history includes CAD in her father  and mother; Crohn's disease in her father and mother; Diabetes in her father; Heart disease in her brother, brother, father, mother, and sister; Hepatitis in her sister; Hypertension in her brother, brother, and sister.    ROS:  Please see the history of present illness.   Otherwise, review of systems are positive for {NONE DEFAULTED:18576::"none"}.   All other systems are reviewed and negative.    PHYSICAL EXAM: VS:  LMP 01/13/2018  , BMI There is no height or weight on file to calculate BMI. GEN: Well nourished, well developed, in no acute distress HEENT: normal Neck: no JVD, carotid bruits, or masses Cardiac: ***RRR; no murmurs, rubs, or gallops,no edema  Respiratory:  clear to auscultation bilaterally, normal work of breathing GI: soft, nontender, nondistended, + BS MS: no deformity or atrophy Skin: warm and dry, no rash Neuro:  Strength and sensation are intact Psych: euthymic mood, full affect   EKG:  EKG {ACTION; IS/IS CBJ:[id number]} ordered today. The ekg ordered today demonstrates ***   Recent Labs: No results found for requested labs within last 8760 hours.    Lipid Panel No results found for: CHOL, TRIG, HDL, CHOLHDL, VLDL, LDLCALC, LDLDIRECT    Wt Readings from Last 3 Encounters:  01/24/20 169 lb (76.7 kg)  12/29/19 175 lb (79.4 kg)  02/17/19 163 lb 12.8 oz (74.3 kg)      Other studies Reviewed: Additional studies/ records that were reviewed today include: ***. Review of the above records demonstrates: ***   ASSESSMENT AND PLAN:  1.  ***   Current medicines are reviewed at length with the patient today.  The patient {ACTIONS; HAS/DOES NOT HAVE:19233} concerns regarding medicines.  The following changes have been made:  {PLAN; NO CHANGE:13088:s}  Labs/ tests ordered today include: *** No orders of the defined types were placed in this encounter.    Disposition:   FU with *** in {gen number 6-16:073710} {Days to years:10300}  Signed, Georgie Chard, NP  09/07/2020 12:58 PM    [facility] [address] [place], [place], [place]  [address] Phone: [phone]; Fax: [phone]

## 2020-09-10 ENCOUNTER — Ambulatory Visit: Payer: 59 | Admitting: Cardiology

## 2020-09-23 ENCOUNTER — Encounter: Payer: Self-pay | Admitting: General Practice

## 2021-01-29 DIAGNOSIS — Z0100 Encounter for examination of eyes and vision without abnormal findings: Secondary | ICD-10-CM | POA: Diagnosis not present

## 2021-01-29 DIAGNOSIS — H5200 Hypermetropia, unspecified eye: Secondary | ICD-10-CM | POA: Diagnosis not present

## 2021-09-02 ENCOUNTER — Other Ambulatory Visit: Payer: Self-pay

## 2021-09-02 ENCOUNTER — Other Ambulatory Visit: Payer: Self-pay | Admitting: Family Medicine

## 2021-09-02 ENCOUNTER — Ambulatory Visit
Admission: RE | Admit: 2021-09-02 | Discharge: 2021-09-02 | Disposition: A | Discharge status: Home / Self Care | Payer: Medicare Other | Source: Ambulatory Visit | Attending: Family Medicine | Admitting: Family Medicine

## 2021-09-02 ENCOUNTER — Ambulatory Visit
Admission: RE | Admit: 2021-09-02 | Discharge: 2021-09-02 | Disposition: A | Discharge status: Home / Self Care | Payer: 59 | Source: Ambulatory Visit | Attending: Family Medicine | Admitting: Family Medicine

## 2021-09-02 DIAGNOSIS — R059 Cough, unspecified: Secondary | ICD-10-CM | POA: Diagnosis not present

## 2021-09-02 DIAGNOSIS — Z23 Encounter for immunization: Secondary | ICD-10-CM | POA: Diagnosis not present

## 2021-09-02 DIAGNOSIS — R058 Other specified cough: Secondary | ICD-10-CM

## 2021-09-02 DIAGNOSIS — Z1231 Encounter for screening mammogram for malignant neoplasm of breast: Secondary | ICD-10-CM

## 2021-09-02 DIAGNOSIS — E785 Hyperlipidemia, unspecified: Secondary | ICD-10-CM | POA: Diagnosis not present

## 2021-09-02 DIAGNOSIS — Z0000 Encounter for general adult medical examination without abnormal findings: Secondary | ICD-10-CM | POA: Diagnosis not present

## 2021-09-02 DIAGNOSIS — Z1211 Encounter for screening for malignant neoplasm of colon: Secondary | ICD-10-CM | POA: Diagnosis not present

## 2021-09-02 DIAGNOSIS — I7300 Raynaud's syndrome without gangrene: Secondary | ICD-10-CM | POA: Diagnosis not present

## 2021-09-05 ENCOUNTER — Other Ambulatory Visit: Payer: Self-pay | Admitting: Family Medicine

## 2021-09-05 DIAGNOSIS — R928 Other abnormal and inconclusive findings on diagnostic imaging of breast: Secondary | ICD-10-CM

## 2021-09-30 DIAGNOSIS — M5126 Other intervertebral disc displacement, lumbar region: Secondary | ICD-10-CM | POA: Diagnosis not present

## 2021-09-30 DIAGNOSIS — M5416 Radiculopathy, lumbar region: Secondary | ICD-10-CM | POA: Diagnosis not present

## 2021-09-30 DIAGNOSIS — M47816 Spondylosis without myelopathy or radiculopathy, lumbar region: Secondary | ICD-10-CM | POA: Diagnosis not present

## 2021-10-01 ENCOUNTER — Other Ambulatory Visit: Payer: Medicare Other

## 2021-10-30 DIAGNOSIS — M5416 Radiculopathy, lumbar region: Secondary | ICD-10-CM | POA: Diagnosis not present

## 2021-10-30 DIAGNOSIS — M47816 Spondylosis without myelopathy or radiculopathy, lumbar region: Secondary | ICD-10-CM | POA: Diagnosis not present

## 2021-11-04 ENCOUNTER — Other Ambulatory Visit: Payer: Self-pay | Admitting: Rehabilitation

## 2021-11-04 DIAGNOSIS — M5416 Radiculopathy, lumbar region: Secondary | ICD-10-CM

## 2021-11-19 ENCOUNTER — Other Ambulatory Visit: Payer: Medicare Other

## 2021-11-28 ENCOUNTER — Other Ambulatory Visit: Payer: Medicare Other

## 2021-11-28 ENCOUNTER — Other Ambulatory Visit: Payer: Self-pay

## 2021-11-29 ENCOUNTER — Other Ambulatory Visit: Payer: Self-pay

## 2022-01-02 ENCOUNTER — Ambulatory Visit: Payer: Self-pay

## 2022-01-02 ENCOUNTER — Ambulatory Visit
Admission: RE | Admit: 2022-01-02 | Discharge: 2022-01-02 | Disposition: A | Discharge status: Home / Self Care | Payer: Medicare Other | Source: Ambulatory Visit | Attending: Family Medicine | Admitting: Family Medicine

## 2022-01-02 DIAGNOSIS — R922 Inconclusive mammogram: Secondary | ICD-10-CM | POA: Diagnosis not present

## 2022-01-02 DIAGNOSIS — R928 Other abnormal and inconclusive findings on diagnostic imaging of breast: Secondary | ICD-10-CM

## 2022-05-25 DIAGNOSIS — H5203 Hypermetropia, bilateral: Secondary | ICD-10-CM | POA: Diagnosis not present

## 2022-07-09 DIAGNOSIS — Z23 Encounter for immunization: Secondary | ICD-10-CM | POA: Diagnosis not present

## 2022-07-09 DIAGNOSIS — M5416 Radiculopathy, lumbar region: Secondary | ICD-10-CM | POA: Diagnosis not present

## 2022-09-08 ENCOUNTER — Other Ambulatory Visit: Payer: Self-pay | Admitting: Family Medicine

## 2022-09-08 DIAGNOSIS — Z1231 Encounter for screening mammogram for malignant neoplasm of breast: Secondary | ICD-10-CM

## 2022-09-11 DIAGNOSIS — Z1211 Encounter for screening for malignant neoplasm of colon: Secondary | ICD-10-CM | POA: Diagnosis not present

## 2022-09-11 DIAGNOSIS — G479 Sleep disorder, unspecified: Secondary | ICD-10-CM | POA: Diagnosis not present

## 2022-09-11 DIAGNOSIS — E785 Hyperlipidemia, unspecified: Secondary | ICD-10-CM | POA: Diagnosis not present

## 2022-09-11 DIAGNOSIS — Z0000 Encounter for general adult medical examination without abnormal findings: Secondary | ICD-10-CM | POA: Diagnosis not present

## 2022-09-11 DIAGNOSIS — R5383 Other fatigue: Secondary | ICD-10-CM | POA: Diagnosis not present

## 2022-10-08 DIAGNOSIS — M5416 Radiculopathy, lumbar region: Secondary | ICD-10-CM | POA: Diagnosis not present

## 2022-10-08 DIAGNOSIS — M7061 Trochanteric bursitis, right hip: Secondary | ICD-10-CM | POA: Diagnosis not present

## 2022-10-13 ENCOUNTER — Ambulatory Visit: Payer: Medicare Other

## 2022-10-23 ENCOUNTER — Ambulatory Visit
Admission: RE | Admit: 2022-10-23 | Discharge: 2022-10-23 | Disposition: A | Discharge status: Home / Self Care | Payer: Medicare Other | Source: Ambulatory Visit | Attending: Family Medicine | Admitting: Family Medicine

## 2022-10-23 DIAGNOSIS — Z1231 Encounter for screening mammogram for malignant neoplasm of breast: Secondary | ICD-10-CM

## 2022-10-28 ENCOUNTER — Ambulatory Visit
Admission: RE | Admit: 2022-10-28 | Discharge: 2022-10-28 | Disposition: A | Discharge status: Home / Self Care | Payer: Medicare Other | Source: Ambulatory Visit | Attending: Rehabilitation | Admitting: Rehabilitation

## 2022-10-28 DIAGNOSIS — M545 Low back pain, unspecified: Secondary | ICD-10-CM | POA: Diagnosis not present

## 2022-10-28 DIAGNOSIS — M48061 Spinal stenosis, lumbar region without neurogenic claudication: Secondary | ICD-10-CM | POA: Diagnosis not present

## 2022-10-28 DIAGNOSIS — M25552 Pain in left hip: Secondary | ICD-10-CM | POA: Diagnosis not present

## 2022-10-28 DIAGNOSIS — M5416 Radiculopathy, lumbar region: Secondary | ICD-10-CM

## 2022-10-29 DIAGNOSIS — M5416 Radiculopathy, lumbar region: Secondary | ICD-10-CM | POA: Diagnosis not present

## 2022-10-29 DIAGNOSIS — M545 Low back pain, unspecified: Secondary | ICD-10-CM | POA: Diagnosis not present

## 2022-12-25 DIAGNOSIS — K080 Exfoliation of teeth due to systemic causes: Secondary | ICD-10-CM | POA: Diagnosis not present

## 2022-12-28 DIAGNOSIS — K080 Exfoliation of teeth due to systemic causes: Secondary | ICD-10-CM | POA: Diagnosis not present

## 2023-01-27 DIAGNOSIS — K080 Exfoliation of teeth due to systemic causes: Secondary | ICD-10-CM | POA: Diagnosis not present

## 2023-03-25 DIAGNOSIS — K080 Exfoliation of teeth due to systemic causes: Secondary | ICD-10-CM | POA: Diagnosis not present

## 2023-09-21 DIAGNOSIS — L659 Nonscarring hair loss, unspecified: Secondary | ICD-10-CM | POA: Diagnosis not present

## 2023-09-21 DIAGNOSIS — K649 Unspecified hemorrhoids: Secondary | ICD-10-CM | POA: Diagnosis not present

## 2023-09-21 DIAGNOSIS — E785 Hyperlipidemia, unspecified: Secondary | ICD-10-CM | POA: Diagnosis not present

## 2023-09-21 DIAGNOSIS — Z0000 Encounter for general adult medical examination without abnormal findings: Secondary | ICD-10-CM | POA: Diagnosis not present

## 2023-09-21 DIAGNOSIS — R6889 Other general symptoms and signs: Secondary | ICD-10-CM | POA: Diagnosis not present

## 2024-08-15 ENCOUNTER — Ambulatory Visit
Admission: RE | Admit: 2024-08-15 | Discharge: 2024-08-15 | Disposition: A | Source: Ambulatory Visit | Attending: Family Medicine | Admitting: Family Medicine

## 2024-08-15 ENCOUNTER — Other Ambulatory Visit: Payer: Self-pay | Admitting: Family Medicine

## 2024-08-15 DIAGNOSIS — Z1231 Encounter for screening mammogram for malignant neoplasm of breast: Secondary | ICD-10-CM

## 2024-10-04 DIAGNOSIS — E785 Hyperlipidemia, unspecified: Secondary | ICD-10-CM | POA: Diagnosis not present

## 2024-10-04 DIAGNOSIS — L659 Nonscarring hair loss, unspecified: Secondary | ICD-10-CM | POA: Diagnosis not present

## 2024-10-04 DIAGNOSIS — Z0000 Encounter for general adult medical examination without abnormal findings: Secondary | ICD-10-CM | POA: Diagnosis not present

## 2024-10-04 DIAGNOSIS — Z6830 Body mass index (BMI) 30.0-30.9, adult: Secondary | ICD-10-CM | POA: Diagnosis not present

## 2024-10-04 DIAGNOSIS — E2839 Other primary ovarian failure: Secondary | ICD-10-CM | POA: Diagnosis not present

## 2024-10-04 DIAGNOSIS — Z131 Encounter for screening for diabetes mellitus: Secondary | ICD-10-CM | POA: Diagnosis not present

## 2024-10-04 DIAGNOSIS — Z1159 Encounter for screening for other viral diseases: Secondary | ICD-10-CM | POA: Diagnosis not present
